# Patient Record
Sex: Female | Born: 1985 | Race: Asian | Hispanic: No | Marital: Married | Smoking: Never smoker
Health system: Southern US, Community
[De-identification: ages and names within clinical notes are randomized; demographics above are authoritative.]

## PROBLEM LIST (undated history)

## (undated) DIAGNOSIS — R2232 Localized swelling, mass and lump, left upper limb: Secondary | ICD-10-CM

## (undated) DIAGNOSIS — Z789 Other specified health status: Secondary | ICD-10-CM

## (undated) HISTORY — PX: MASS EXCISION: SHX2000

## (undated) HISTORY — PX: NO PAST SURGERIES: SHX2092

---

## 2009-12-26 ENCOUNTER — Emergency Department (HOSPITAL_COMMUNITY): Admission: EM | Admit: 2009-12-26 | Discharge: 2009-12-26 | Payer: Self-pay | Admitting: Emergency Medicine

## 2010-06-16 ENCOUNTER — Ambulatory Visit (HOSPITAL_COMMUNITY): Admission: RE | Admit: 2010-06-16 | Payer: Medicaid Other | Source: Ambulatory Visit

## 2010-06-16 ENCOUNTER — Ambulatory Visit (HOSPITAL_COMMUNITY)
Admission: RE | Admit: 2010-06-16 | Discharge: 2010-06-16 | Disposition: A | Discharge status: Home / Self Care | Payer: Medicaid Other | Source: Ambulatory Visit | Attending: Obstetrics and Gynecology | Admitting: Obstetrics and Gynecology

## 2010-06-16 ENCOUNTER — Other Ambulatory Visit (HOSPITAL_COMMUNITY): Payer: Self-pay | Admitting: Obstetrics and Gynecology

## 2010-06-16 ENCOUNTER — Ambulatory Visit (HOSPITAL_COMMUNITY)
Admission: RE | Admit: 2010-06-16 | Discharge: 2010-06-16 | Disposition: A | Discharge status: Home / Self Care | Payer: Medicaid Other | Source: Ambulatory Visit

## 2010-06-16 DIAGNOSIS — Z3689 Encounter for other specified antenatal screening: Secondary | ICD-10-CM | POA: Insufficient documentation

## 2010-06-16 DIAGNOSIS — O365990 Maternal care for other known or suspected poor fetal growth, unspecified trimester, not applicable or unspecified: Secondary | ICD-10-CM | POA: Insufficient documentation

## 2010-06-16 DIAGNOSIS — O3500X0 Maternal care for (suspected) central nervous system malformation or damage in fetus, unspecified, not applicable or unspecified: Secondary | ICD-10-CM | POA: Insufficient documentation

## 2010-06-16 DIAGNOSIS — G9389 Other specified disorders of brain: Secondary | ICD-10-CM

## 2010-06-16 DIAGNOSIS — O350XX0 Maternal care for (suspected) central nervous system malformation in fetus, not applicable or unspecified: Secondary | ICD-10-CM | POA: Insufficient documentation

## 2010-06-16 DIAGNOSIS — Q899 Congenital malformation, unspecified: Secondary | ICD-10-CM

## 2010-06-25 LAB — POCT I-STAT, CHEM 8
Creatinine, Ser: 0.5 mg/dL (ref 0.4–1.2)
Glucose, Bld: 81 mg/dL (ref 70–99)
HCT: 48 % — ABNORMAL HIGH (ref 36.0–46.0)
Hemoglobin: 16.3 g/dL — ABNORMAL HIGH (ref 12.0–15.0)
Potassium: 3.8 mEq/L (ref 3.5–5.1)
Sodium: 138 mEq/L (ref 135–145)
TCO2: 23 mmol/L (ref 0–100)

## 2010-06-25 LAB — URINE MICROSCOPIC-ADD ON

## 2010-06-25 LAB — URINALYSIS, ROUTINE W REFLEX MICROSCOPIC
Glucose, UA: NEGATIVE mg/dL
Hgb urine dipstick: NEGATIVE
Ketones, ur: NEGATIVE mg/dL
Protein, ur: NEGATIVE mg/dL
pH: 7.5 (ref 5.0–8.0)

## 2010-06-25 LAB — POCT PREGNANCY, URINE: Preg Test, Ur: POSITIVE

## 2010-07-16 ENCOUNTER — Other Ambulatory Visit (HOSPITAL_COMMUNITY): Payer: Self-pay | Admitting: Obstetrics and Gynecology

## 2010-07-16 ENCOUNTER — Ambulatory Visit (HOSPITAL_COMMUNITY)
Admission: RE | Admit: 2010-07-16 | Discharge: 2010-07-16 | Disposition: A | Discharge status: Home / Self Care | Payer: Medicaid Other | Source: Ambulatory Visit | Attending: Obstetrics and Gynecology | Admitting: Obstetrics and Gynecology

## 2010-07-16 ENCOUNTER — Encounter (HOSPITAL_COMMUNITY): Payer: Self-pay

## 2010-07-16 DIAGNOSIS — Q899 Congenital malformation, unspecified: Secondary | ICD-10-CM

## 2010-07-16 DIAGNOSIS — Z3689 Encounter for other specified antenatal screening: Secondary | ICD-10-CM | POA: Insufficient documentation

## 2010-07-16 DIAGNOSIS — O350XX0 Maternal care for (suspected) central nervous system malformation in fetus, not applicable or unspecified: Secondary | ICD-10-CM | POA: Insufficient documentation

## 2010-07-16 DIAGNOSIS — O3500X0 Maternal care for (suspected) central nervous system malformation or damage in fetus, unspecified, not applicable or unspecified: Secondary | ICD-10-CM | POA: Insufficient documentation

## 2010-08-07 ENCOUNTER — Ambulatory Visit (HOSPITAL_COMMUNITY)
Admission: RE | Admit: 2010-08-07 | Discharge: 2010-08-07 | Disposition: A | Discharge status: Home / Self Care | Payer: BC Managed Care – PPO | Source: Ambulatory Visit | Attending: Obstetrics and Gynecology | Admitting: Obstetrics and Gynecology

## 2010-08-07 DIAGNOSIS — Z3689 Encounter for other specified antenatal screening: Secondary | ICD-10-CM | POA: Insufficient documentation

## 2010-08-07 DIAGNOSIS — O3500X0 Maternal care for (suspected) central nervous system malformation or damage in fetus, unspecified, not applicable or unspecified: Secondary | ICD-10-CM | POA: Insufficient documentation

## 2010-08-07 DIAGNOSIS — Q899 Congenital malformation, unspecified: Secondary | ICD-10-CM

## 2010-08-07 DIAGNOSIS — O350XX0 Maternal care for (suspected) central nervous system malformation in fetus, not applicable or unspecified: Secondary | ICD-10-CM | POA: Insufficient documentation

## 2010-08-19 ENCOUNTER — Inpatient Hospital Stay (HOSPITAL_COMMUNITY)
Admission: AD | Admit: 2010-08-19 | Discharge: 2010-08-21 | DRG: 373 | Disposition: A | Discharge status: Home / Self Care | Payer: BC Managed Care – PPO | Source: Ambulatory Visit | Attending: Obstetrics and Gynecology | Admitting: Obstetrics and Gynecology

## 2010-08-19 LAB — CBC
MCH: 28 pg (ref 26.0–34.0)
MCHC: 33.6 g/dL (ref 30.0–36.0)
Platelets: 271 10*3/uL (ref 150–400)

## 2010-08-19 LAB — RPR: RPR Ser Ql: NONREACTIVE

## 2010-08-20 LAB — CBC
MCHC: 32.3 g/dL (ref 30.0–36.0)
Platelets: 254 10*3/uL (ref 150–400)
RDW: 14.2 % (ref 11.5–15.5)
WBC: 19.4 10*3/uL — ABNORMAL HIGH (ref 4.0–10.5)

## 2010-08-27 NOTE — Discharge Summary (Signed)
Tanya Nielsen, Tanya Nielsen                    ACCOUNT NO.:  [id number]  MEDICAL RECORD NO.:  [id number]           PATIENT TYPE:  I  LOCATION:  9119                          FACILITY:  [facility]  PHYSICIAN:  Sherron Monday, MD        DATE OF BIRTH:  [birth year]  DATE OF ADMISSION:  08/19/2010 DATE OF DISCHARGE:  08/21/2010                              DISCHARGE SUMMARY   ADMITTING DIAGNOSIS:  Intrauterine pregnancy at term in labor.  DISCHARGE DIAGNOSIS:  Intrauterine pregnancy at term in labor, delivered via spontaneous vaginal delivery.  HISTORY OF PRESENT ILLNESS:  A 25 year old G1 P0 at 38+ in early labor. Cervical change documented at MAU 3 cm to 4 cm.  She states she has had good fetal movement, no loss of fluid, no vaginal bleeding, contractions are increasing in times and frequency.  Pregnancy was complicated by bilateral ventriculomegaly, followed up by ultrasound and has resolved.  PAST MEDICAL HISTORY:  Not significant.  PAST SURGICAL HISTORY:  Not significant.  PAST OBSTETRICS AND GYNECOLOGIC HISTORY:  G1 is the present pregnancy. No history of any sexually transmitted diseases or abnormal Pap smears.  MEDICATIONS:  Prenatal vitamins.  ALLERGIES:  No known drug allergies.  No latex allergies.  SOCIAL HISTORY:  Denies alcohol, tobacco, or drug use.  She is married.  FAMILY HISTORY:  Denied.  PRENATAL LABORATORY DATA:  B+, antibody screen negative.  Hemoglobin 12.8, rubella immune, RPR nonreactive, hepatitis B surface antigen negative, HIV negative.  Platelets 381,000.  Hemoglobin electrophoresis within normal limits.  Gonorrhea negative.  Chlamydia negative.  Quad screen within normal limits.  Glucola 114.  Group B strep was negative.  Ultrasound consistent with LMP with an [facility] of May 17th.  Ultrasound revealed normal anatomy of female infant, ventriculomegaly, and a posterior placenta.  Multiple ultrasounds to follow up ventriculomegaly.  On admission, afebrile.  Vital signs  stable with a benign exam. Cervical exam was 6 cm dilated, 90% effaced, and zero station after AROM was performed.  Pitocin was planned for augmentation if needed.  She progressed to anterior lip complete __________ , placenta +2.  She pushed for approximately 10 minutes __________ anterior lip and pushed for 40 minutes to deliver a viable female infant at 1651 p.m. with Apgars of 8 at 1 minute and 9 at 5 minutes and weight of 5 pounds 15 ounces.  Placenta was expressed intact.  OB laceration:  Second-degree perineal laceration and left labial repaired with 3-0 Vicryl Rapide in a typical fashion.  EBL was less than 500 mL.  Her postpartum course was relatively uncomplicated.  She remained afebrile with vital signs stable throughout.  Hemoglobin decreased from 12.9-10.6.  She was discharged home on postpartum day x2.  At this time, she is ambulating, voiding, and having a normal lochia, and her pain was well controlled.  She voiced understanding to the routine discharge instructions.  Numbers to call with any questions or problems.  She will follow up in the office in approximately 6 weeks.  She is B+, rubella immune.  __________ and bottle feed.  Hemoglobin decreased from 12.9 to  10.6.  We will discuss contraception at her 6-week checkup.  ASSISTANT:  .  Dictating on this by dictation     Sherron Monday, MD     JB/MEDQ  D:  08/21/2010  T:  08/21/2010  Job:  [id number]  Electronically Signed by Sherron Monday MD on 08/27/2010 08:42:50 AM

## 2011-12-11 IMAGING — US US OB TRANSVAGINAL MODIFY
1 series · 13 of 28 positions shown · non-contrast
Comparison: None.

CLINICAL DATA: Back pain.  Pregnant with quantitative beta HCG 2952

OBSTETRIC <14 WK US AND TRANSVAGINAL OB US
TECHNIQUE: Both transabdominal and transvaginal ultrasound
examinations were performed for complete evaluation of the
gestation as well as the maternal uterus, adnexal regions, and
pelvic cul-de-sac.  Transvaginal technique was performed to assess
early pregnancy.

[Series 1: us ob transvaginal modify · 0.26mm/px · 13 of 54 slices shown]
[im 2/54]
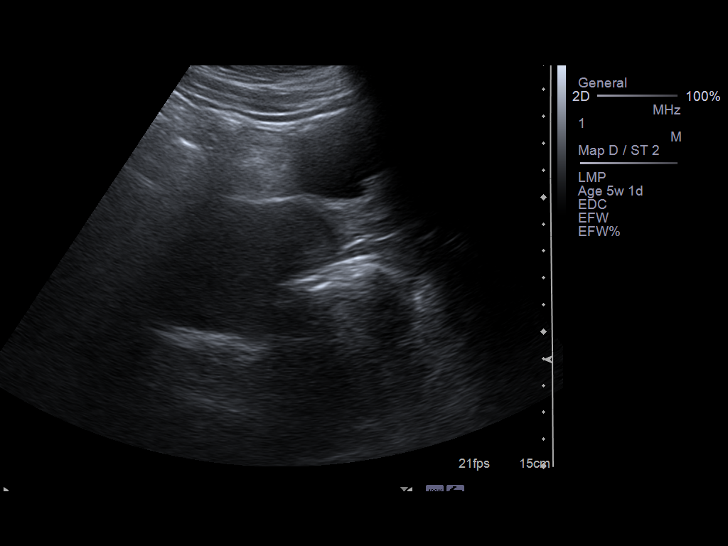
[im 6/54]
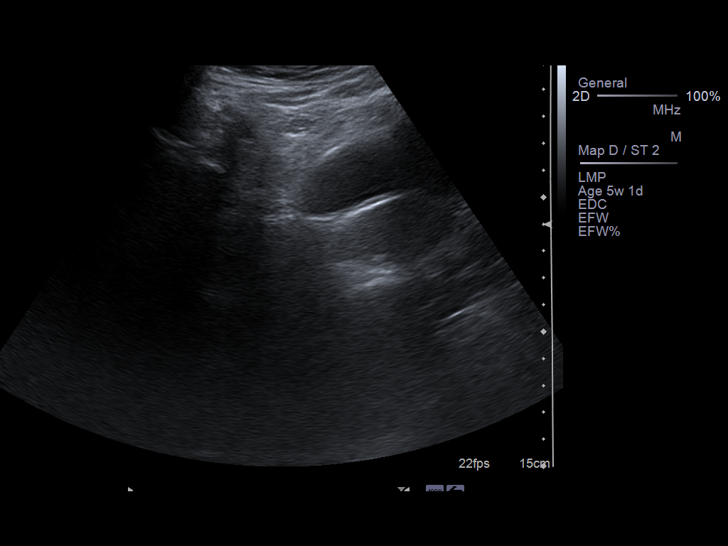
[im 10/54]
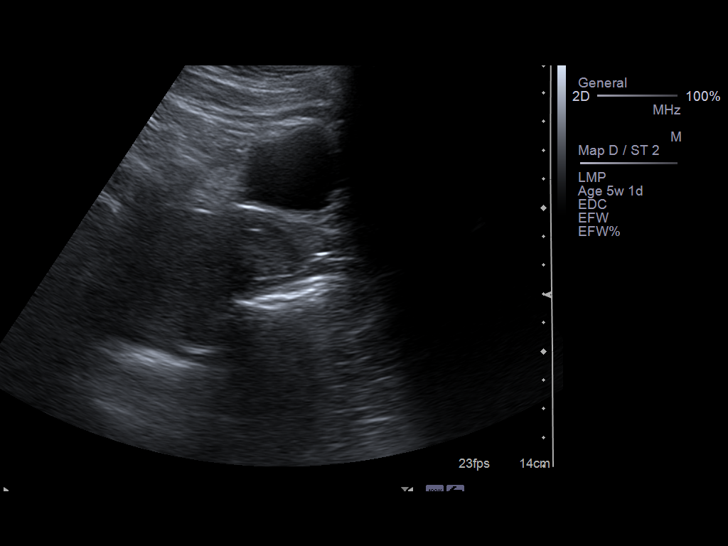
[im 14/54]
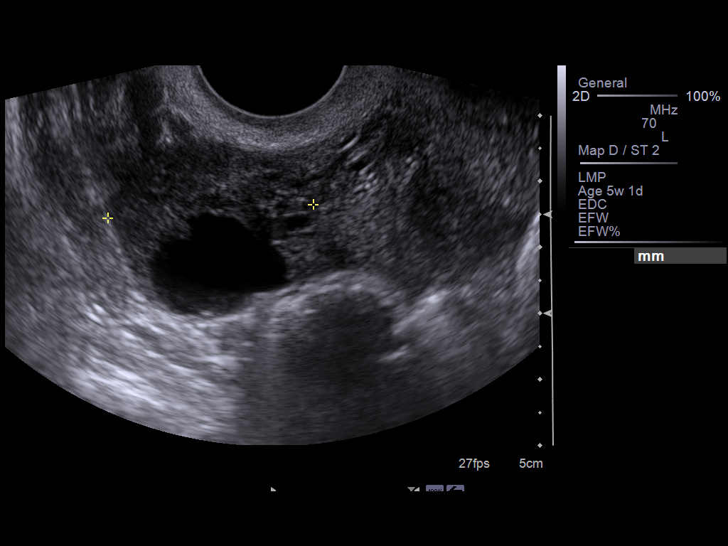
[im 18/54]
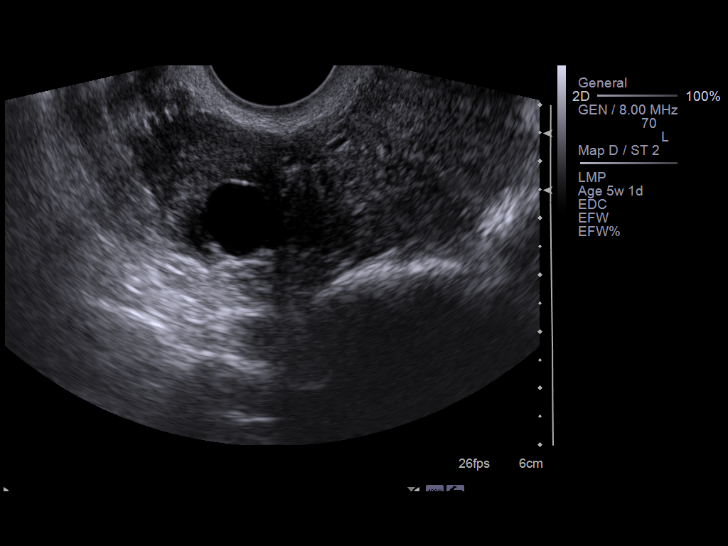
[im 22/54]
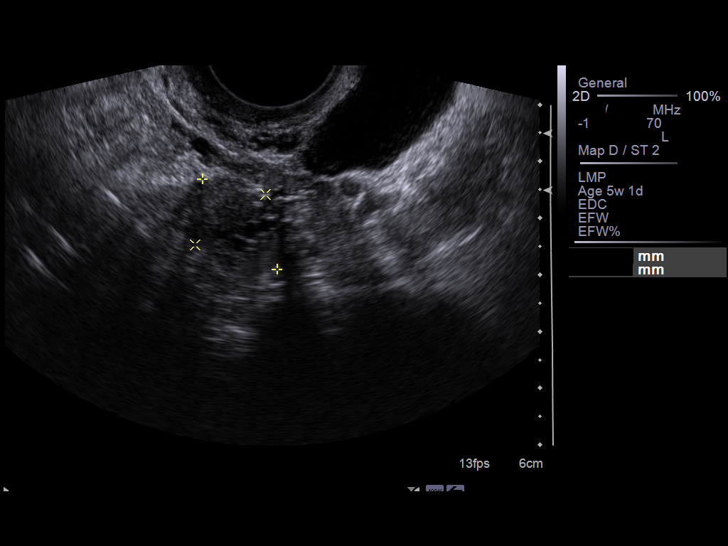
[im 28/54]
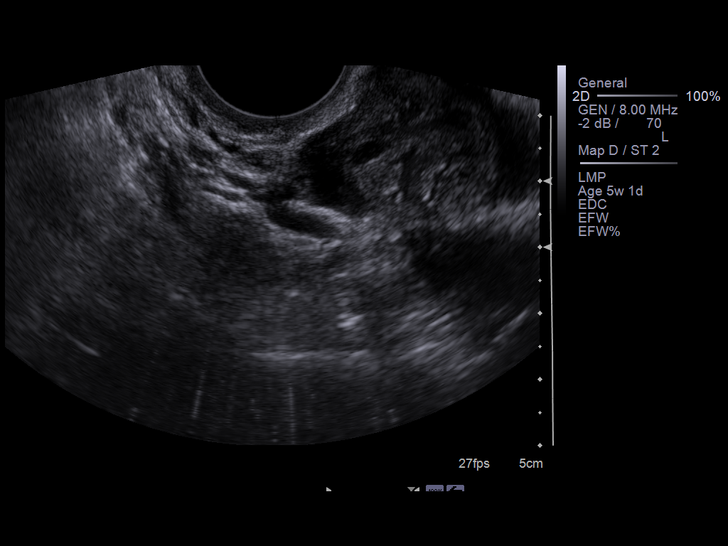
[im 32/54]
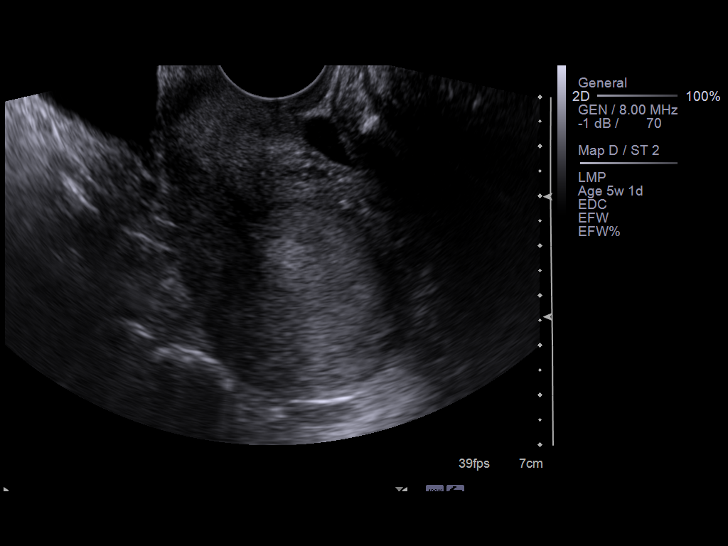
[im 36/54]
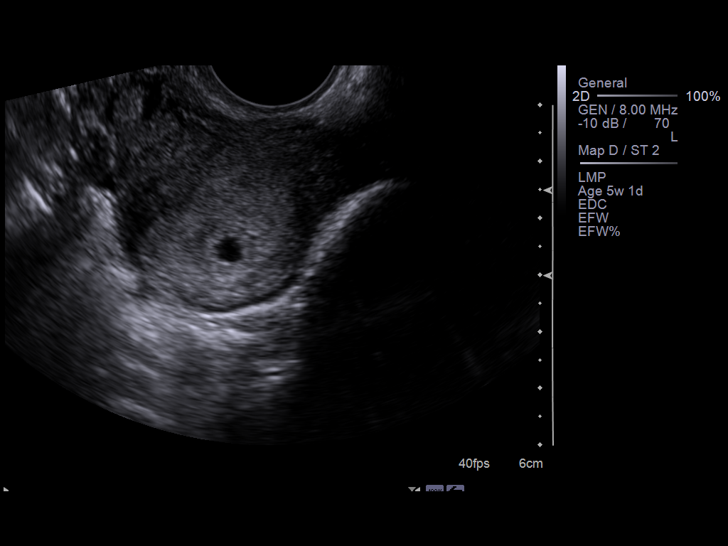
[im 40/54]
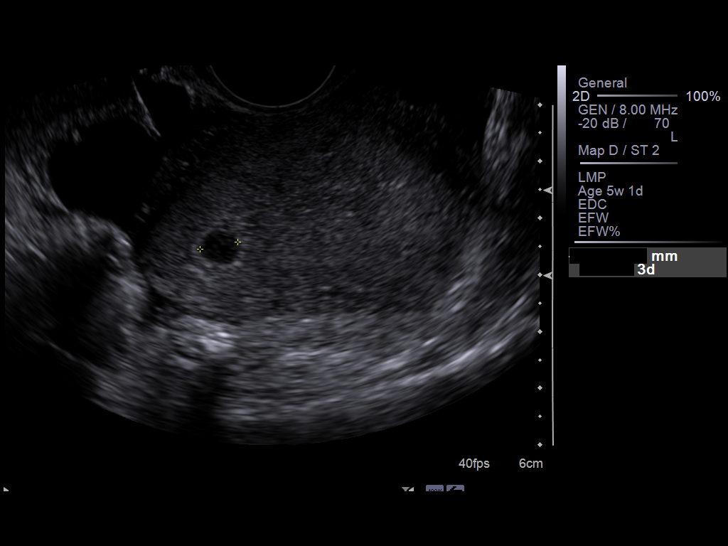
[im 44/54]
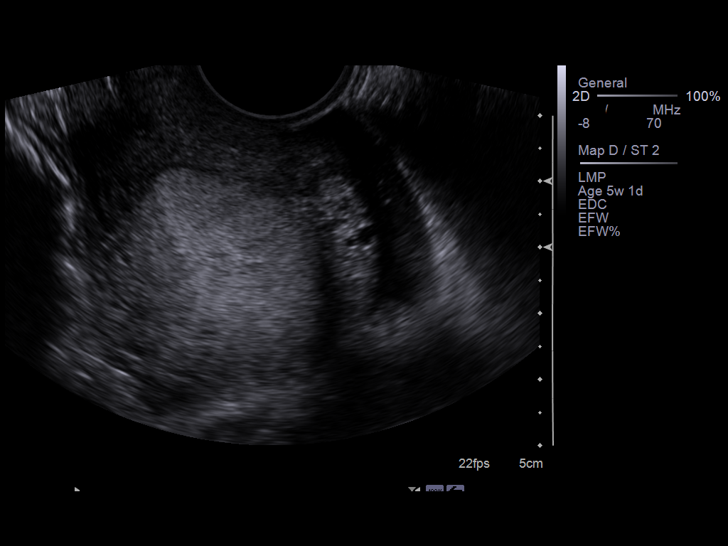
[im 48/54]
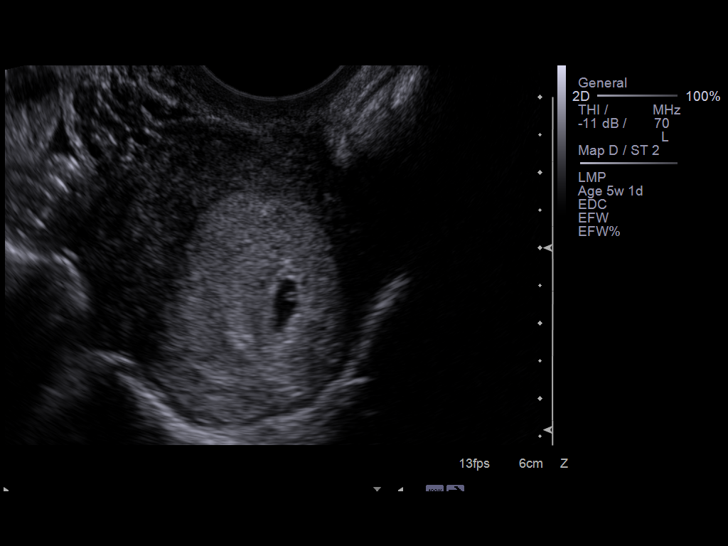
[im 52/54]
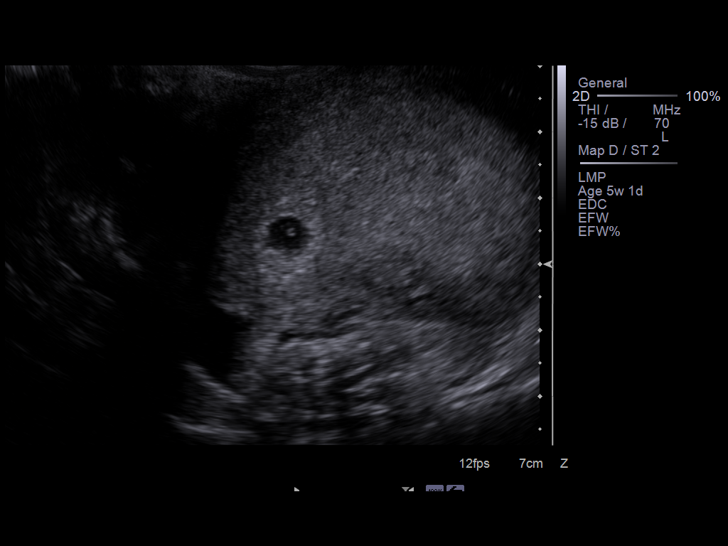

[13 of 28 positions shown; findings below may reference images not displayed]

Intrauterine gestational sac:  Visualized/normal in shape.
Yolk sac: Seen
Embryo: Not seen
Cardiac Activity: Not seen
Heart Rate: Not applicable bpm

MSD: 5.3 mm  mm  5    w 2    d
CRL:    mm     w     d        US EDC:

Maternal uterus/adnexae:
The fundal contour of the uterus appears concave suggesting the
presence of a bicornuate uterus. The gestational sac is located to
the right of midline.

The right ovary measures 3.9 x 3.3 x 3.1 cm and contains a corpus
luteum.  The left ovary has a normal appearance measuring 2.1 x
x 1.5 cm.
IMPRESSION: Intrauterine gestational sac with yolk sac.  No evidence for a
fetal pole is seen but would not be expected at today's mean sac
diameter of 5.3 mm.  With normal growth rates we would expect to
see evidence for a fetal pole in 2 weeks.

Images of the fundal contour of the uterus suggest the possibility
of a bicornuate uterus.

Normal ovaries

## 2012-06-26 ENCOUNTER — Encounter (HOSPITAL_COMMUNITY): Payer: Self-pay | Admitting: *Deleted

## 2012-06-26 ENCOUNTER — Inpatient Hospital Stay (HOSPITAL_COMMUNITY)
Admission: AD | Admit: 2012-06-26 | Discharge: 2012-06-29 | DRG: 765 | Disposition: A | Discharge status: Home / Self Care | Payer: No Typology Code available for payment source | Source: Ambulatory Visit | Attending: Obstetrics and Gynecology | Admitting: Obstetrics and Gynecology

## 2012-06-26 ENCOUNTER — Encounter (HOSPITAL_COMMUNITY): Payer: Self-pay | Admitting: Anesthesiology

## 2012-06-26 ENCOUNTER — Encounter (HOSPITAL_COMMUNITY)
Admission: AD | Disposition: A | Discharge status: Home / Self Care | Payer: Self-pay | Source: Ambulatory Visit | Attending: Obstetrics and Gynecology

## 2012-06-26 ENCOUNTER — Inpatient Hospital Stay (HOSPITAL_COMMUNITY): Payer: No Typology Code available for payment source | Admitting: Anesthesiology

## 2012-06-26 DIAGNOSIS — O321XX0 Maternal care for breech presentation, not applicable or unspecified: Principal | ICD-10-CM | POA: Diagnosis present

## 2012-06-26 HISTORY — DX: Other specified health status: Z78.9

## 2012-06-26 LAB — AMNISURE RUPTURE OF MEMBRANE (ROM) NOT AT ARMC: Amnisure ROM: POSITIVE

## 2012-06-26 LAB — ABO/RH: ABO/RH(D): B POS

## 2012-06-26 LAB — CBC
Platelets: 314 10*3/uL (ref 150–400)
RBC: 4.15 MIL/uL (ref 3.87–5.11)
RDW: 14.9 % (ref 11.5–15.5)
WBC: 13.3 10*3/uL — ABNORMAL HIGH (ref 4.0–10.5)

## 2012-06-26 LAB — TYPE AND SCREEN

## 2012-06-26 SURGERY — Surgical Case
Anesthesia: Spinal | Site: Abdomen | Wound class: Clean Contaminated

## 2012-06-26 MED ORDER — CITRIC ACID-SODIUM CITRATE 334-500 MG/5ML PO SOLN
30.0000 mL | Freq: Once | ORAL | Status: AC
  Administered 2012-06-26: 30 mL via ORAL
  Filled 2012-06-26: qty 15

## 2012-06-26 MED ORDER — NALBUPHINE HCL 10 MG/ML IJ SOLN
5.0000 mg | INTRAMUSCULAR | Status: DC | PRN
  Administered 2012-06-26 – 2012-06-27 (×2): 10 mg via INTRAVENOUS
  Filled 2012-06-26 (×2): qty 1

## 2012-06-26 MED ORDER — IBUPROFEN 600 MG PO TABS: 600.0000 mg | ORAL_TABLET | Freq: Four times a day (QID) | ORAL | Status: DC | PRN

## 2012-06-26 MED ORDER — SIMETHICONE 80 MG PO CHEW
80.0000 mg | CHEWABLE_TABLET | Freq: Three times a day (TID) | ORAL | Status: DC
  Administered 2012-06-28 – 2012-06-29 (×4): 80 mg via ORAL

## 2012-06-26 MED ORDER — METOCLOPRAMIDE HCL 5 MG/ML IJ SOLN: 10.0000 mg | Freq: Three times a day (TID) | INTRAMUSCULAR | Status: DC | PRN

## 2012-06-26 MED ORDER — FAMOTIDINE IN NACL 20-0.9 MG/50ML-% IV SOLN
20.0000 mg | Freq: Once | INTRAVENOUS | Status: AC
  Administered 2012-06-26: 20 mg via INTRAVENOUS
  Filled 2012-06-26: qty 50

## 2012-06-26 MED ORDER — DIPHENHYDRAMINE HCL 50 MG/ML IJ SOLN
INTRAMUSCULAR | Status: AC
  Filled 2012-06-26: qty 1

## 2012-06-26 MED ORDER — ONDANSETRON HCL 4 MG/2ML IJ SOLN: 4.0000 mg | INTRAMUSCULAR | Status: DC | PRN

## 2012-06-26 MED ORDER — NALOXONE HCL 0.4 MG/ML IJ SOLN: 0.4000 mg | INTRAMUSCULAR | Status: DC | PRN

## 2012-06-26 MED ORDER — LACTATED RINGERS IV SOLN
INTRAVENOUS | Status: DC
  Administered 2012-06-26 (×3): via INTRAVENOUS

## 2012-06-26 MED ORDER — ONDANSETRON HCL 4 MG/2ML IJ SOLN: 4.0000 mg | Freq: Three times a day (TID) | INTRAMUSCULAR | Status: DC | PRN

## 2012-06-26 MED ORDER — DIBUCAINE 1 % RE OINT: 1.0000 "application " | TOPICAL_OINTMENT | RECTAL | Status: DC | PRN

## 2012-06-26 MED ORDER — PRENATAL MULTIVITAMIN CH
1.0000 | ORAL_TABLET | Freq: Every day | ORAL | Status: DC
  Administered 2012-06-27 – 2012-06-28 (×2): 1 via ORAL
  Filled 2012-06-26 (×2): qty 1

## 2012-06-26 MED ORDER — DIPHENHYDRAMINE HCL 25 MG PO CAPS
25.0000 mg | ORAL_CAPSULE | ORAL | Status: DC | PRN
  Filled 2012-06-26: qty 1

## 2012-06-26 MED ORDER — DIPHENHYDRAMINE HCL 25 MG PO CAPS: 25.0000 mg | ORAL_CAPSULE | Freq: Four times a day (QID) | ORAL | Status: DC | PRN

## 2012-06-26 MED ORDER — PHENYLEPHRINE HCL 10 MG/ML IJ SOLN
INTRAMUSCULAR | Status: DC | PRN
  Administered 2012-06-26 (×4): 40 ug via INTRAVENOUS

## 2012-06-26 MED ORDER — KETOROLAC TROMETHAMINE 30 MG/ML IJ SOLN
30.0000 mg | Freq: Four times a day (QID) | INTRAMUSCULAR | Status: AC | PRN
  Administered 2012-06-27: 30 mg via INTRAVENOUS
  Filled 2012-06-26: qty 1

## 2012-06-26 MED ORDER — HYDROMORPHONE HCL PF 1 MG/ML IJ SOLN: 0.2500 mg | INTRAMUSCULAR | Status: DC | PRN

## 2012-06-26 MED ORDER — SODIUM CHLORIDE 0.9 % IR SOLN
Status: DC | PRN
  Administered 2012-06-26: 1000 mL

## 2012-06-26 MED ORDER — DIPHENHYDRAMINE HCL 50 MG/ML IJ SOLN
12.5000 mg | INTRAMUSCULAR | Status: DC | PRN
  Administered 2012-06-26: 12.5 mg via INTRAVENOUS

## 2012-06-26 MED ORDER — KETOROLAC TROMETHAMINE 60 MG/2ML IM SOLN
60.0000 mg | Freq: Once | INTRAMUSCULAR | Status: AC | PRN
  Administered 2012-06-26: 60 mg via INTRAMUSCULAR

## 2012-06-26 MED ORDER — ONDANSETRON HCL 4 MG/2ML IJ SOLN
INTRAMUSCULAR | Status: DC | PRN
  Administered 2012-06-26: 4 mg via INTRAVENOUS

## 2012-06-26 MED ORDER — LACTATED RINGERS IV SOLN
INTRAVENOUS | Status: DC
  Administered 2012-06-26 – 2012-06-27 (×2): via INTRAVENOUS

## 2012-06-26 MED ORDER — KETOROLAC TROMETHAMINE 60 MG/2ML IM SOLN
INTRAMUSCULAR | Status: AC
  Filled 2012-06-26: qty 2

## 2012-06-26 MED ORDER — OXYTOCIN 40 UNITS IN LACTATED RINGERS INFUSION - SIMPLE MED: 62.5000 mL/h | INTRAVENOUS | Status: AC

## 2012-06-26 MED ORDER — ONDANSETRON HCL 4 MG PO TABS: 4.0000 mg | ORAL_TABLET | ORAL | Status: DC | PRN

## 2012-06-26 MED ORDER — FENTANYL CITRATE 0.05 MG/ML IJ SOLN
INTRAMUSCULAR | Status: DC | PRN
  Administered 2012-06-26: 25 ug via INTRATHECAL

## 2012-06-26 MED ORDER — NALOXONE HCL 1 MG/ML IJ SOLN: 1.0000 ug/kg/h | INTRAVENOUS | Status: DC | PRN

## 2012-06-26 MED ORDER — CEFAZOLIN SODIUM-DEXTROSE 2-3 GM-% IV SOLR
2.0000 g | Freq: Once | INTRAVENOUS | Status: AC
  Administered 2012-06-26: 2 g via INTRAVENOUS
  Filled 2012-06-26: qty 50

## 2012-06-26 MED ORDER — SIMETHICONE 80 MG PO CHEW
80.0000 mg | CHEWABLE_TABLET | ORAL | Status: DC | PRN
  Administered 2012-06-27 – 2012-06-28 (×2): 80 mg via ORAL

## 2012-06-26 MED ORDER — TETANUS-DIPHTH-ACELL PERTUSSIS 5-2.5-18.5 LF-MCG/0.5 IM SUSP: 0.5000 mL | Freq: Once | INTRAMUSCULAR | Status: DC

## 2012-06-26 MED ORDER — SODIUM CHLORIDE 0.9 % IJ SOLN: 3.0000 mL | INTRAMUSCULAR | Status: DC | PRN

## 2012-06-26 MED ORDER — SENNOSIDES-DOCUSATE SODIUM 8.6-50 MG PO TABS
2.0000 | ORAL_TABLET | Freq: Every day | ORAL | Status: DC
  Administered 2012-06-27 – 2012-06-28 (×2): 2 via ORAL

## 2012-06-26 MED ORDER — ZOLPIDEM TARTRATE 5 MG PO TABS: 5.0000 mg | ORAL_TABLET | Freq: Every evening | ORAL | Status: DC | PRN

## 2012-06-26 MED ORDER — IBUPROFEN 600 MG PO TABS
600.0000 mg | ORAL_TABLET | Freq: Four times a day (QID) | ORAL | Status: DC
  Administered 2012-06-27 – 2012-06-29 (×8): 600 mg via ORAL
  Filled 2012-06-26 (×8): qty 1

## 2012-06-26 MED ORDER — SCOPOLAMINE 1 MG/3DAYS TD PT72
1.0000 | MEDICATED_PATCH | Freq: Once | TRANSDERMAL | Status: DC
  Administered 2012-06-26: 1.5 mg via TRANSDERMAL

## 2012-06-26 MED ORDER — OXYTOCIN 10 UNIT/ML IJ SOLN
40.0000 [IU] | INTRAVENOUS | Status: DC | PRN
  Administered 2012-06-26: 40 [IU] via INTRAVENOUS

## 2012-06-26 MED ORDER — KETOROLAC TROMETHAMINE 30 MG/ML IJ SOLN: 30.0000 mg | Freq: Four times a day (QID) | INTRAMUSCULAR | Status: AC | PRN

## 2012-06-26 MED ORDER — OXYCODONE-ACETAMINOPHEN 5-325 MG PO TABS
1.0000 | ORAL_TABLET | ORAL | Status: DC | PRN
  Administered 2012-06-28: 1 via ORAL
  Filled 2012-06-26: qty 1

## 2012-06-26 MED ORDER — MORPHINE SULFATE (PF) 0.5 MG/ML IJ SOLN
INTRAMUSCULAR | Status: DC | PRN
  Administered 2012-06-26: .15 ug via INTRATHECAL

## 2012-06-26 MED ORDER — DIPHENHYDRAMINE HCL 50 MG/ML IJ SOLN: 25.0000 mg | INTRAMUSCULAR | Status: DC | PRN

## 2012-06-26 MED ORDER — NAPHAZOLINE HCL 0.1 % OP SOLN
1.0000 [drp] | Freq: Four times a day (QID) | OPHTHALMIC | Status: DC | PRN
  Filled 2012-06-26: qty 15

## 2012-06-26 MED ORDER — MENTHOL 3 MG MT LOZG: 1.0000 | LOZENGE | OROMUCOSAL | Status: DC | PRN

## 2012-06-26 MED ORDER — MEPERIDINE HCL 25 MG/ML IJ SOLN: 6.2500 mg | INTRAMUSCULAR | Status: DC | PRN

## 2012-06-26 MED ORDER — SCOPOLAMINE 1 MG/3DAYS TD PT72
MEDICATED_PATCH | TRANSDERMAL | Status: AC
  Filled 2012-06-26: qty 1

## 2012-06-26 MED ORDER — WITCH HAZEL-GLYCERIN EX PADS: 1.0000 "application " | MEDICATED_PAD | CUTANEOUS | Status: DC | PRN

## 2012-06-26 MED ORDER — NALBUPHINE HCL 10 MG/ML IJ SOLN: 5.0000 mg | INTRAMUSCULAR | Status: DC | PRN

## 2012-06-26 MED ORDER — LANOLIN HYDROUS EX OINT
1.0000 | TOPICAL_OINTMENT | CUTANEOUS | Status: DC | PRN
Start: 2012-06-26 — End: 2012-06-29

## 2012-06-26 SURGICAL SUPPLY — 34 items
BENZOIN TINCTURE PRP APPL 2/3 (GAUZE/BANDAGES/DRESSINGS) ×2 IMPLANT
CLOTH BEACON ORANGE TIMEOUT ST (SAFETY) ×2 IMPLANT
CONTAINER PREFILL 10% NBF 15ML (MISCELLANEOUS) IMPLANT
DRAPE LG THREE QUARTER DISP (DRAPES) ×2 IMPLANT
DRSG OPSITE POSTOP 4X10 (GAUZE/BANDAGES/DRESSINGS) ×2 IMPLANT
DURAPREP 26ML APPLICATOR (WOUND CARE) ×2 IMPLANT
ELECT REM PT RETURN 9FT ADLT (ELECTROSURGICAL) ×2
ELECTRODE REM PT RTRN 9FT ADLT (ELECTROSURGICAL) ×1 IMPLANT
EXTRACTOR VACUUM KIWI (MISCELLANEOUS) IMPLANT
EXTRACTOR VACUUM M CUP 4 TUBE (SUCTIONS) IMPLANT
GLOVE BIO SURGEON STRL SZ 6.5 (GLOVE) ×2 IMPLANT
GOWN STRL REIN XL XLG (GOWN DISPOSABLE) ×4 IMPLANT
KIT ABG SYR 3ML LUER SLIP (SYRINGE) IMPLANT
NEEDLE HYPO 25X5/8 SAFETYGLIDE (NEEDLE) IMPLANT
NS IRRIG 1000ML POUR BTL (IV SOLUTION) ×2 IMPLANT
PACK C SECTION WH (CUSTOM PROCEDURE TRAY) ×2 IMPLANT
PAD OB MATERNITY 4.3X12.25 (PERSONAL CARE ITEMS) ×2 IMPLANT
RTRCTR C-SECT PINK 25CM LRG (MISCELLANEOUS) ×2 IMPLANT
SLEEVE SCD COMPRESS KNEE MED (MISCELLANEOUS) ×2 IMPLANT
STAPLER VISISTAT 35W (STAPLE) IMPLANT
STRIP CLOSURE SKIN 1/2X4 (GAUZE/BANDAGES/DRESSINGS) ×2 IMPLANT
SUT CHROMIC 1 CTX 36 (SUTURE) ×6 IMPLANT
SUT PLAIN 0 NONE (SUTURE) IMPLANT
SUT PLAIN 2 0 (SUTURE) ×1
SUT PLAIN 2 0 XLH (SUTURE) IMPLANT
SUT PLAIN ABS 2-0 CT1 27XMFL (SUTURE) ×1 IMPLANT
SUT VIC AB 0 CT1 27 (SUTURE) ×2
SUT VIC AB 0 CT1 27XBRD ANBCTR (SUTURE) ×2 IMPLANT
SUT VIC AB 2-0 CT1 27 (SUTURE) ×1
SUT VIC AB 2-0 CT1 TAPERPNT 27 (SUTURE) ×1 IMPLANT
SUT VIC AB 4-0 KS 27 (SUTURE) ×2 IMPLANT
TOWEL OR 17X24 6PK STRL BLUE (TOWEL DISPOSABLE) ×6 IMPLANT
TRAY FOLEY CATH 14FR (SET/KITS/TRAYS/PACK) ×2 IMPLANT
WATER STERILE IRR 1000ML POUR (IV SOLUTION) ×2 IMPLANT

## 2012-06-26 NOTE — Transfer of Care (Signed)
Immediate Anesthesia Transfer of Care Note  Patient: Tanya Nielsen  Procedure(s) Performed: Procedure(s):  Primary cesarean section with delivery of baby boy at 55. Apgars 9/9. (N/A)  Patient Location: PACU  Anesthesia Type:Spinal  Level of Consciousness: awake, alert  and oriented  Airway & Oxygen Therapy: Patient Spontanous Breathing  Post-op Assessment: Report given to PACU RN and Post -op Vital signs reviewed and stable  Post vital signs: Reviewed and stable  Complications: No apparent anesthesia complications

## 2012-06-26 NOTE — Anesthesia Postprocedure Evaluation (Signed)
Anesthesia Post Note  Patient: Tanya Nielsen  Procedure(s) Performed: Procedure(s) (LRB):  Primary cesarean section with delivery of baby boy at 51. Apgars 9/9. (N/A)  Anesthesia type: Spinal  Patient location: PACU  Post pain: Pain level controlled  Post assessment: Post-op Vital signs reviewed  Last Vitals:  Filed Vitals:   06/26/12 1930  BP: 111/74  Pulse: 97  Temp:   Resp: 24    Post vital signs: Reviewed  Level of consciousness: awake  Complications: No apparent anesthesia complications

## 2012-06-26 NOTE — Brief Op Note (Signed)
06/26/2012  7:18 PM  PATIENT:  Tanya Nielsen  27 y.o. female  PRE-OPERATIVE DIAGNOSIS:   Breech presentation/ruptured membranes Preterm pregnancy at 23 3/7 weeks  POST-OPERATIVE DIAGNOSIS:  breech presentation/ruptured membranes  PROCEDURE:  Procedure(s):  Primary cesarean section with delivery of baby boy at 86. Apgars 9/9. (N/A)  SURGEON:  Surgeon(s) and Role:    * Oliver Pila, MD - Primary  ANESTHESIA:   spinal  EBL:   800cc  BLOOD ADMINISTERED:none  DRAINS: Urinary Catheter (Foley)   LOCAL MEDICATIONS USED:  NONE  SPECIMEN:  Placenta  DISPOSITION OF SPECIMEN:  L&D  COUNTS:  YES  TOURNIQUET:  * No tourniquets in log *  DICTATION: .Dragon Dictation  PLAN OF CARE: Admit to inpatient   PATIENT DISPOSITION:  PACU - hemodynamically stable.

## 2012-06-26 NOTE — MAU Note (Signed)
Sent over from office for [facility]; pt at regular appt today, ? Leaking since Sat night, AFI 8.

## 2012-06-26 NOTE — Op Note (Signed)
Operative report  Preoperative diagnosis Preterm pregnancy at 36-3/7 weeks Spontaneous rupture of membranes Breech presentation  Postoperative diagnosis Same  Procedure Primary low transverse C-section with 2 layer closure of uterus  Surgeon Dr. Huel Cote  Anesthesia Spinal  Fluids Estimated blood loss 800 cc Urine output 200 cc clear urine IV fluids 2200 cc LR  Findings There is a viable female infant in the frank breech presentation. Apgars were 8 and 9. Weight pending at time of dictation. Uterus tubes and ovaries were all within normal limits   Specimen Placenta sent to L&D  Procedure note After informed consent was obtained from the patient she was taken to the operating room where spinal anesthesia was obtained without difficulty. She was then prepped and draped in the normal sterile fashion in the dorsal supine position with a leftward tilt. An appropriate time out was performed. A Pfannenstiel skin incision was then made with the scalpel and carried through to the underlying layer fascia by sharp dissection and Bovie cautery. The fascia was nicked in the midline and the incision was extended laterally. The inferior aspect of the incision was grasped with Coker clamps elevated and dissected off the underlying rectus muscles. In a similar fashion the superior aspect was dissected off the rectus muscles. Rectus muscles were separated in the midline and the peritoneum entered bluntly. Peritoneal incision was then extended both superiorly and inferiorly with careful attention to avoid both bowel and bladder. The Alexis self-retaining wound retractor was then placed within the incision and the lower uterine segment exposed. The lower uterine segment was then incised in a transverse fashion and the cavity itself entered bluntly. The infant was then delivered from the frank breech presentation without difficulty the legs were easily reduced as well as the arms swept across the  chest and head delivered in a flexed position. Cord was clamped and cut and infant  handed to the waiting pediatricians. The placenta was then spontaneously expressed and the uterus cleared of all clots and debris with a moist lap sponge. The uterus was then closed in 2 layers the first a running locked layer of 1-0 chromic and the second an imbricating layer of the same suture. 2 additional figure-of-eight sutures were placed at the incision line for hemostasis. Tubes and ovaries were inspected and the gutters cleared of all clots and debris. The incision appeared hemostatic therefore all instruments and sponges were removed from the abdomen. The rectus muscles and peritoneum were reapproximated with several interrupted mattress sutures of 2-0 Vicryl. The fascia was then closed with 0 Vicryl in a running fashion. The subcutaneous tissue was reapproximated with 2-0 plain in running fashion. The skin was closed with 4-0 Vicryl in a subcuticular stitch on a Keith needle. The incision was then reinforced with benzoin and Steri-Strips and a dressing applied. Again all instrument sponge counts were correct and the patient was taken to the recovery room in good condition with her baby accompanying her.

## 2012-06-26 NOTE — Anesthesia Preprocedure Evaluation (Addendum)
Anesthesia Evaluation  Patient identified by MRN, date of birth, ID band Patient awake    Reviewed: Allergy & Precautions, H&P , Patient's Chart, lab work & pertinent test results  Airway Mallampati: IV TM Distance: >3 FB Neck ROM: full    Dental no notable dental hx.    Pulmonary  breath sounds clear to auscultation  Pulmonary exam normal       Cardiovascular Exercise Tolerance: Good Rhythm:regular Rate:Normal     Neuro/Psych    GI/Hepatic   Endo/Other    Renal/GU      Musculoskeletal   Abdominal   Peds  Hematology   Anesthesia Other Findings   Reproductive/Obstetrics                          Anesthesia Physical Anesthesia Plan  ASA: II  Anesthesia Plan: Spinal   Post-op Pain Management:    Induction:   Airway Management Planned:   Additional Equipment:   Intra-op Plan:   Post-operative Plan:   Informed Consent: I have reviewed the patients History and Physical, chart, labs and discussed the procedure including the risks, benefits and alternatives for the proposed anesthesia with the patient or authorized representative who has indicated his/her understanding and acceptance.   Dental Advisory Given  Plan Discussed with: CRNA  Anesthesia Plan Comments: (Lab work confirmed with CRNA in room. Platelets okay. Discussed spinal anesthetic, and patient consents to the procedure:  included risk of possible headache,backache, failed block, allergic reaction, and nerve injury. This patient was asked if she had any questions or concerns before the procedure started. )        Anesthesia Quick Evaluation

## 2012-06-26 NOTE — OR Nursing (Addendum)
Uterus massaged by S. Tallyn Holroyd after cesarean section. Two tubes of cord blood sent to lab.  30cc of blood evacuated from uterus during uterine massage.

## 2012-06-26 NOTE — Anesthesia Procedure Notes (Signed)

## 2012-06-26 NOTE — H&P (Signed)
Tanya Nielsen is a 27 y.o. female G2P1001 at 36+ weeks (EDD 07/21/12 by LMP c/w 10 week [place]) presenting for possible ROM for 3 days.  Pt came to office for possible ROM and was found to be nitrazine positive, but fern negative.  AFI was borderline at 8.  Baby persistent breech lie.  Prenatal care was otherwise uncomplicated.     Maternal Medical History:  Reason for admission: Rupture of membranes.   Contractions: Onset was more than 2 days ago.   Frequency: rare.   Perceived severity is mild.    Fetal activity: Perceived fetal activity is normal.    Prenatal Complications - Diabetes: none.    OB History   Grav Para Term Preterm Abortions TAB SAB Ect Mult Living   2 1 1       1     NSVD 08/2010 5lbs 15 oz  Past Medical History  Diagnosis Date  . Medical history non-contributory    Past Surgical History  Procedure Laterality Date  . No past surgeries     Family History: family history is not on file. Social History:  reports that she has never smoked. She does not have any smokeless tobacco history on file. She reports that she does not drink alcohol or use illicit drugs.   Prenatal Transfer Tool  Maternal Diabetes: No Genetic Screening: Normal Maternal Ultrasounds/Referrals: Normal Fetal Ultrasounds or other Referrals:  None Maternal Substance Abuse:  No Significant Maternal Medications:  None Significant Maternal Lab Results:  None Other Comments:  None  ROS    Blood pressure 105/68, pulse 109, temperature 98.3 F (36.8 C), temperature source Oral, resp. rate 18, height 4\' 11"  (1.499 m), weight 54.885 kg (121 lb). Maternal Exam:  Uterine Assessment: Contraction strength is mild.  Contraction frequency is irregular.   Abdomen: Patient reports no abdominal tenderness. Estimated fetal weight is 5 1/2-6 lbs.   Fetal presentation: breech  Introitus: Normal vulva. Normal vagina.  Ferning test: negative.  Nitrazine test: positive. Amniotic fluid character:  clear.     Physical Exam  Constitutional: She is oriented to person, place, and time. She appears well-developed and well-nourished.  Cardiovascular: Normal rate and regular rhythm.   Respiratory: Effort normal and breath sounds normal.  GI: Soft. Bowel sounds are normal.  Genitourinary: Vagina normal.  Uterus gravid  Neurological: She is alert and oriented to person, place, and time.  Psychiatric: She has a normal mood and affect. Her behavior is normal.    Prenatal labs: ABO, Rh:  B positive Antibody:  negative  Rubella:  Immune RPR:   neg  HBsAg:   neg HIV:   neg GBS:   unknown--result not returned yet One hour GCT 153 Three hour GTT WNL First trimester screen and AFP WNL CF neg Hgb AA  Assessment/Plan: Pt had amniosure and ROM confirmed with positive result.  Pt counseled re: proceeding with C/S.  Risks and benefits reviewed including bleeding, infection and possible damage to bowel and bladder desires to proceed.  Last ate noodles at 12 noon, will proceed at 6pm per anesthesia.    Oliver Pila 06/26/2012, 4:39 PM

## 2012-06-27 ENCOUNTER — Encounter (HOSPITAL_COMMUNITY): Payer: Self-pay | Admitting: Obstetrics and Gynecology

## 2012-06-27 LAB — CBC
MCV: 78.6 fL (ref 78.0–100.0)
Platelets: 253 10*3/uL (ref 150–400)
RBC: 3.45 MIL/uL — ABNORMAL LOW (ref 3.87–5.11)
WBC: 16.1 10*3/uL — ABNORMAL HIGH (ref 4.0–10.5)

## 2012-06-27 NOTE — Progress Notes (Signed)
Subjective: Postpartum Day 1: Cesarean Delivery Patient reports incisional pain and tolerating PO.    Objective: Vital signs in last 24 hours: Temp:  [98 F (36.7 C)-98.9 F (37.2 C)] 98.2 F (36.8 C) (03/18 0630) Pulse Rate:  [90-116] 97 (03/18 0630) Resp:  [16-25] 20 (03/18 0630) BP: (69-114)/(35-83) 93/60 mmHg (03/18 0630) SpO2:  [95 %-100 %] 96 % (03/18 0630) Weight:  [54.885 kg (121 lb)] 54.885 kg (121 lb) (03/17 1728)  Physical Exam:  General: alert and cooperative Lochia: appropriate Uterine Fundus: firm Incision:c/d/i    Recent Labs  06/26/12 1712 06/27/12 0610  HGB 10.5* 8.7*  HCT 32.6* 27.1*    Assessment/Plan: Status post Cesarean section. Doing well postoperatively.  Continue current care.  Tanya Nielsen 06/27/2012, 8:33 AM

## 2012-06-27 NOTE — Anesthesia Postprocedure Evaluation (Signed)
  Anesthesia Post-op Note  Patient: Tanya Nielsen  Procedure(s) Performed: Procedure(s):  Primary cesarean section with delivery of baby boy at 15. Apgars 9/9. (N/A)  Patient Location: Mother/Baby  Anesthesia Type:Spinal  Level of Consciousness: awake  Airway and Oxygen Therapy: Patient Spontanous Breathing  Post-op Pain: mild  Post-op Assessment: Patient's Cardiovascular Status Stable, Respiratory Function Stable, Patent Airway, No signs of Nausea or vomiting, Adequate PO intake, Pain level controlled, No headache, No backache, No residual numbness and No residual motor weakness  Post-op Vital Signs: Reviewed and stable  Complications: No apparent anesthesia complications

## 2012-06-28 NOTE — Progress Notes (Signed)
Subjective: Postpartum Day 2: Cesarean Delivery Patient reports tolerating PO, + flatus and no problems voiding.    Objective: Vital signs in last 24 hours: Temp:  [97.2 F (36.2 C)-99 F (37.2 C)] 97.2 F (36.2 C) (03/19 0528) Pulse Rate:  [96-99] 99 (03/19 0528) Resp:  [18-20] 18 (03/19 0528) BP: (97-102)/(62-66) 97/62 mmHg (03/19 0528) SpO2:  [96 %] 96 % (03/18 2000)  Physical Exam:  General: alert and cooperative Lochia: appropriate Uterine Fundus: firm Incision: C/D/I    Recent Labs  06/26/12 1712 06/27/12 0610  HGB 10.5* 8.7*  HCT 32.6* 27.1*    Assessment/Plan: Status post Cesarean section. Doing well postoperatively.  Continue current care.  Tanya Nielsen 06/28/2012, 8:54 AM

## 2012-06-29 MED ORDER — IBUPROFEN 600 MG PO TABS: 600.0000 mg | ORAL_TABLET | Freq: Four times a day (QID) | ORAL | Status: DC

## 2012-06-29 MED ORDER — OXYCODONE-ACETAMINOPHEN 5-325 MG PO TABS: 1.0000 | ORAL_TABLET | ORAL | Status: DC | PRN

## 2012-06-29 NOTE — Discharge Summary (Signed)
Obstetric Discharge Summary Reason for Admission: rupture of membranes and breech Prenatal Procedures: ultrasound Intrapartum Procedures: cesarean: low cervical, transverse Postpartum Procedures: none Complications-Operative and Postpartum: none Hemoglobin  Date Value Range Status  06/27/2012 8.7* 12.0 - 15.0 g/dL Final     REPEATED TO VERIFY     DELTA CHECK NOTED     HCT  Date Value Range Status  06/27/2012 27.1* 36.0 - 46.0 % Final    Physical Exam:  General: alert and cooperative Lochia: appropriate Uterine Fundus: firm Incision: healing well, no significant erythema  Discharge Diagnoses: Preterm pregnancy delivered at 36+ weeks  Discharge Information: Date: 06/29/2012 Activity: pelvic rest Diet: routine Medications: Ibuprofen and Percocet Condition: improved Instructions: refer to practice specific booklet Discharge to: home Follow-up Information   Follow up with Oliver Pila, MD. Schedule an appointment as soon as possible for a visit in 2 weeks. (incision check)    Contact information:   [address], [address] [place] [address] [phone]       Newborn Data: Live born female  Birth Weight: 5 lb 12.9 oz (2634 g) APGAR: 9, 9  Home with mother.  Oliver Pila 06/29/2012, 8:35 AM

## 2012-06-29 NOTE — Progress Notes (Signed)
Subjective: Postpartum Day 3 Cesarean Delivery Patient reports tolerating PO, + flatus, + BM and no problems voiding.    Objective: Vital signs in last 24 hours: Temp:  [97.4 F (36.3 C)-99.2 F (37.3 C)] 97.4 F (36.3 C) (03/20 0644) Pulse Rate:  [87-98] 87 (03/20 0644) Resp:  [18] 18 (03/20 0644) BP: (96-104)/(63-64) 96/64 mmHg (03/20 1610)  Physical Exam:  General: alert and cooperative Lochia: appropriate Uterine Fundus: firm Incision: healing well, no significant erythema    Recent Labs  06/26/12 1712 06/27/12 0610  HGB 10.5* 8.7*  HCT 32.6* 27.1*    Assessment/Plan: Status post Cesarean section. Doing well postoperatively.  Discharge home with standard precautions and return to office in 2 weeks for incision check. Motrin and percocet  Rosia Syme W 06/29/2012, 8:34 AM

## 2012-06-30 IMAGING — US US OB FOLLOW-UP
1 series · 14 of 28 positions shown · non-contrast
Comparison: none

[Series 1: us ob follow-up · 14 of 35 slices shown]
[im 2/35]
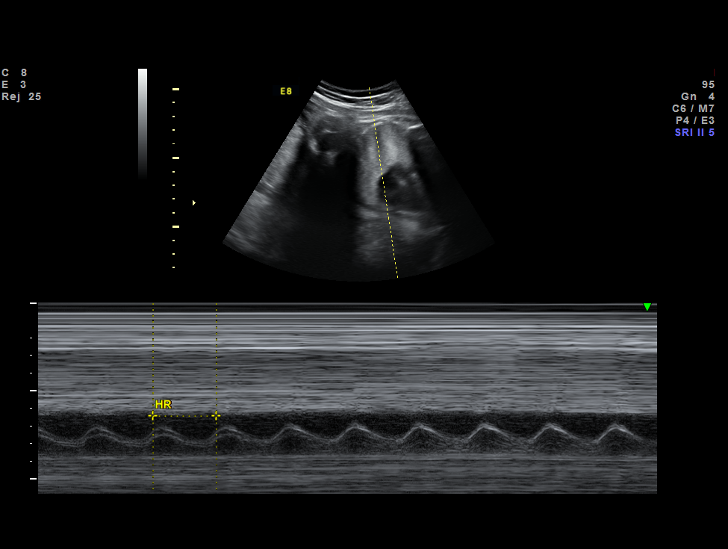
[im 4/35]
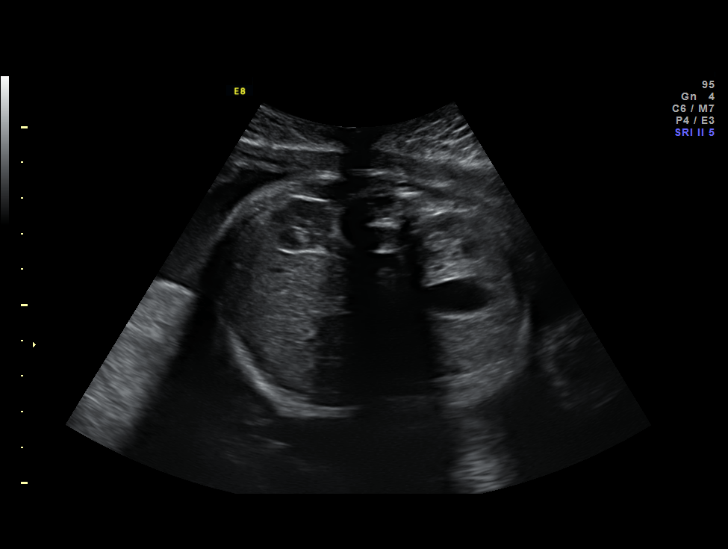
[im 7/35]
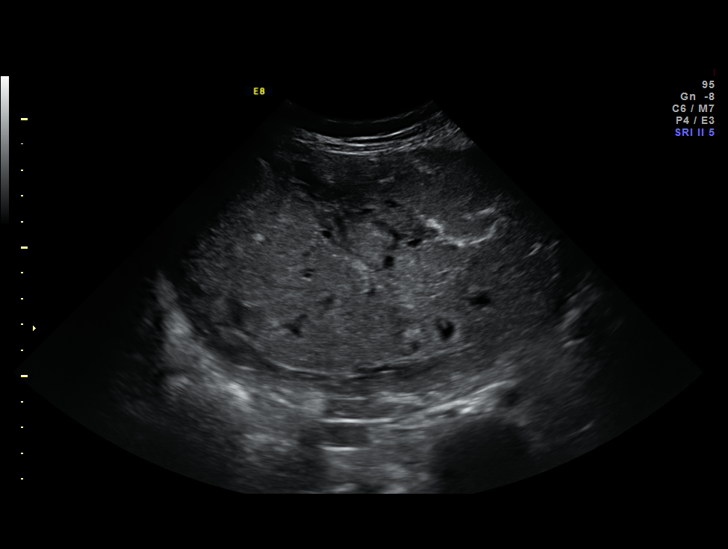
[im 9/35]
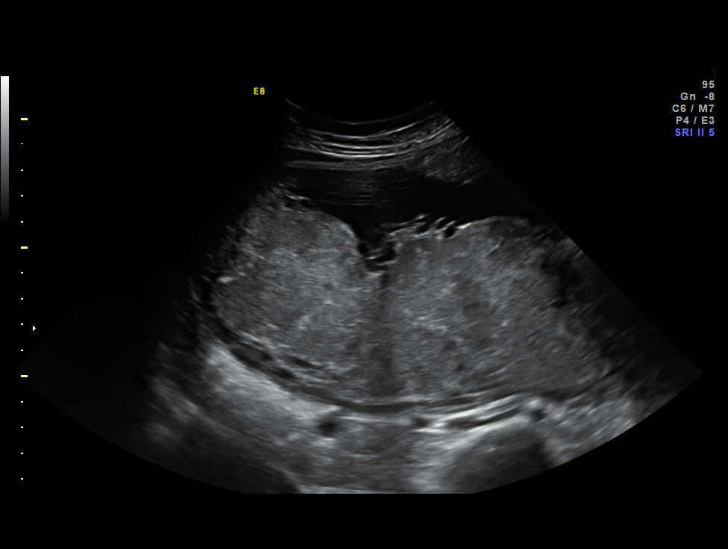
[im 12/35]
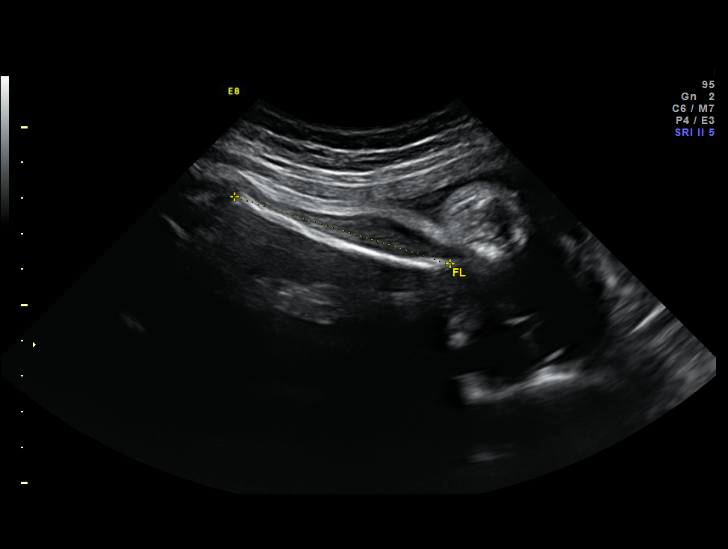
[im 14/35]
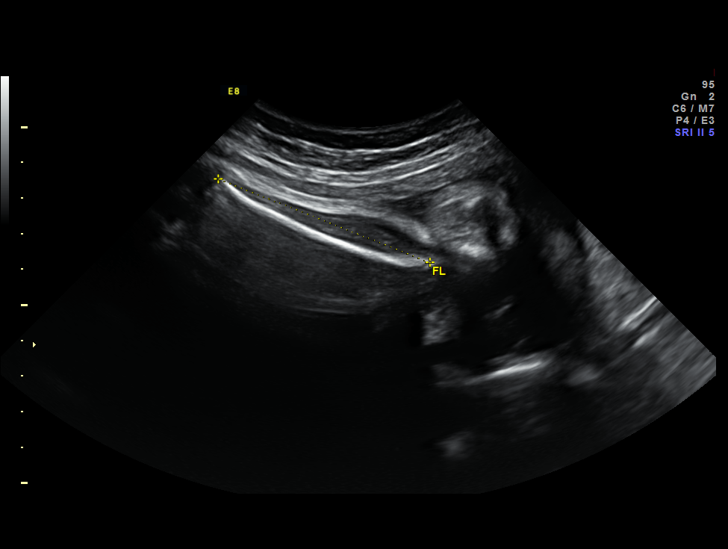
[im 17/35]
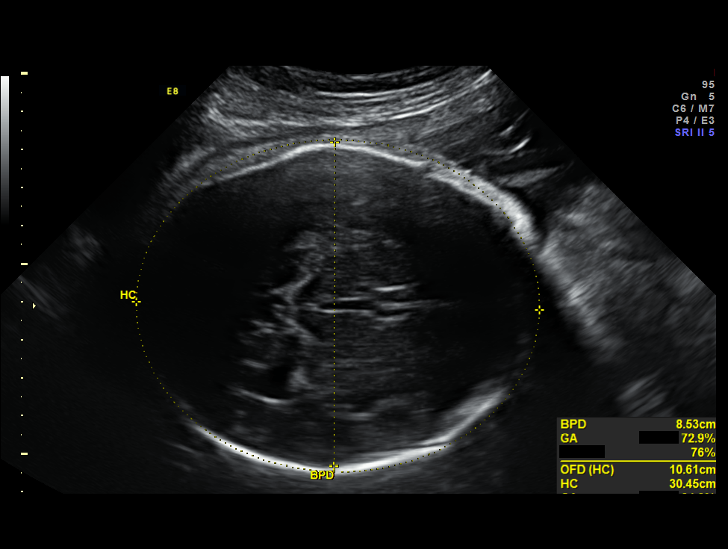
[im 19/35]
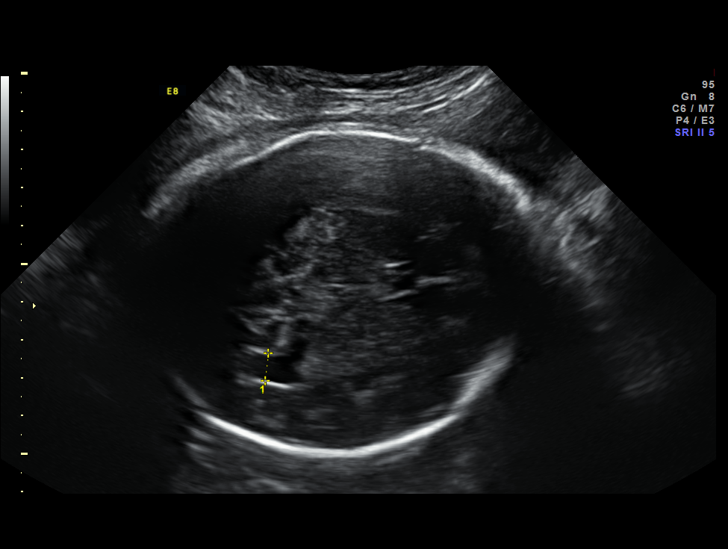
[im 22/35]
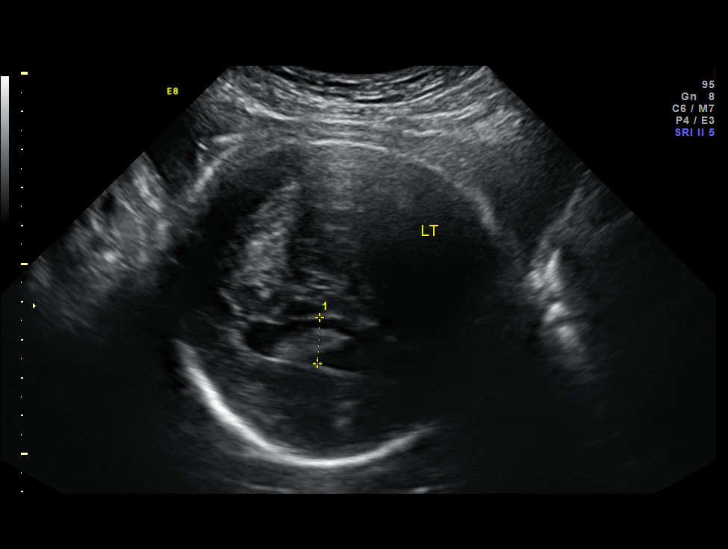
[im 24/35]
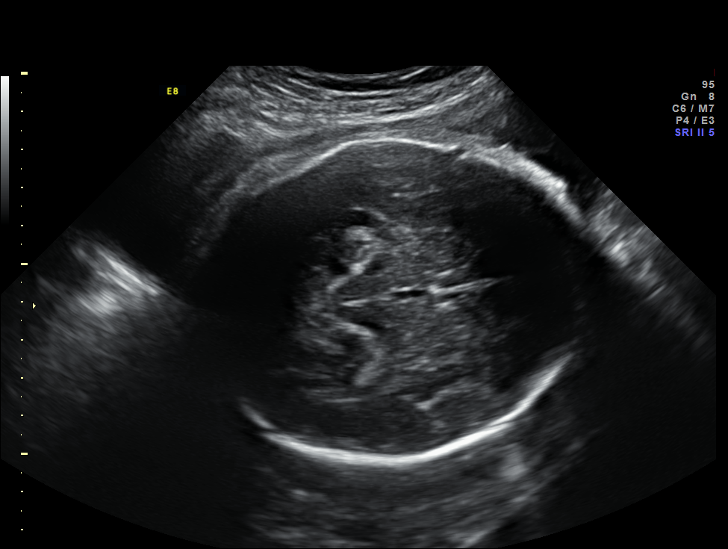
[im 27/35]
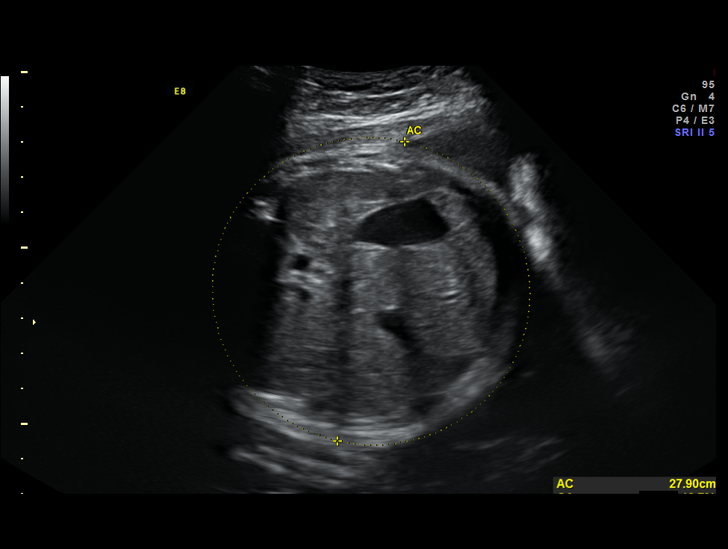
[im 29/35]
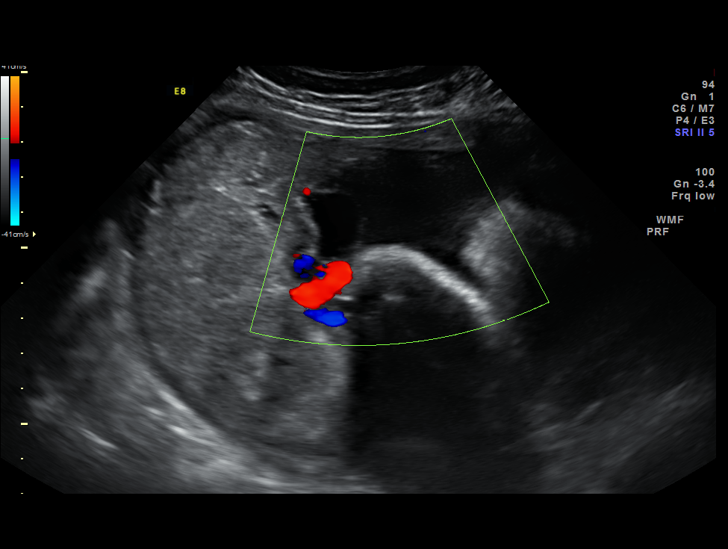
[im 32/35]
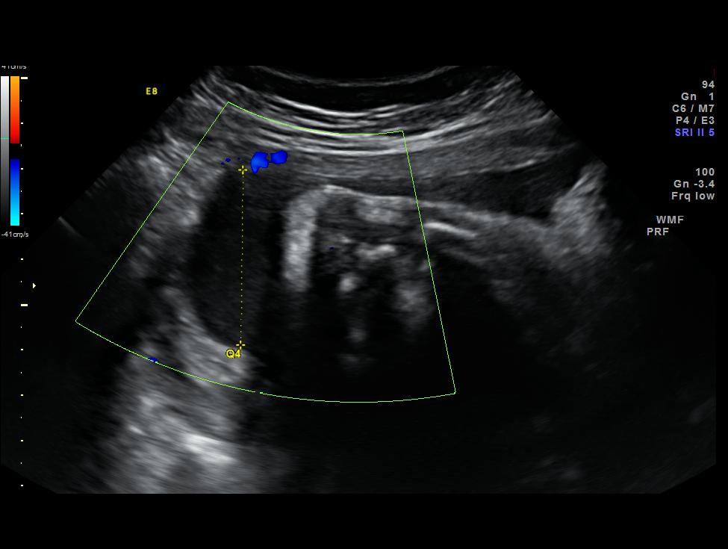
[im 35/35]
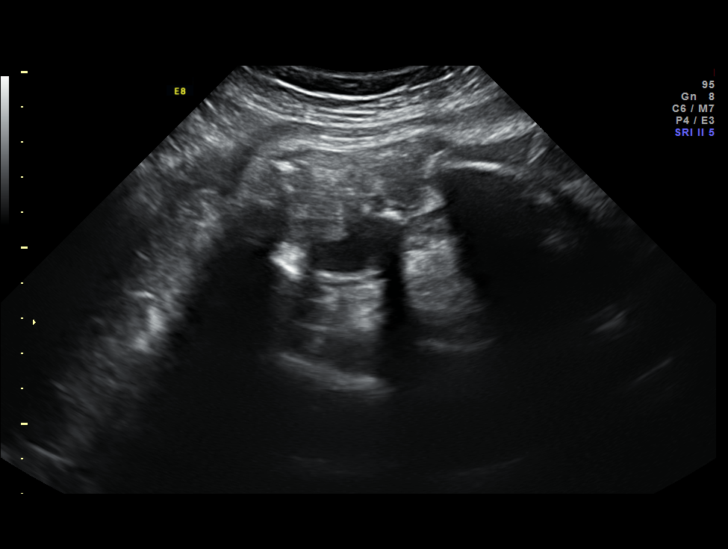

[14 of 28 positions shown; findings below may reference images not displayed]

Canned report from images found in remote index.

Refer to host system for actual result text.

## 2012-06-30 NOTE — Progress Notes (Signed)
Post discharge chart review completed.  

## 2014-02-11 ENCOUNTER — Encounter (HOSPITAL_COMMUNITY): Payer: Self-pay | Admitting: Obstetrics and Gynecology

## 2018-04-12 NOTE — L&D Delivery Note (Signed)
Pt presented to L&D in labor.She desired a TOLAC.She has signed a consent. She progressed along a nl labor curve. She pushed for 10 minutes and then had a SVD of one live viable female infant in the ROA position. Nuchal cord x 1. Placenta- S/I/ Second degree midline tear repaired with 3-0 chromic. Baby to NBN. EBL-400cc

## 2018-08-28 LAB — OB RESULTS CONSOLE ABO/RH: RH Type: POSITIVE

## 2018-08-28 LAB — OB RESULTS CONSOLE RUBELLA ANTIBODY, IGM: Rubella: IMMUNE

## 2018-08-28 LAB — OB RESULTS CONSOLE HIV ANTIBODY (ROUTINE TESTING): HIV: NONREACTIVE

## 2018-08-28 LAB — OB RESULTS CONSOLE VARICELLA ZOSTER ANTIBODY, IGG: Varicella: IMMUNE

## 2018-08-28 LAB — OB RESULTS CONSOLE GC/CHLAMYDIA
Chlamydia: NEGATIVE
Gonorrhea: NEGATIVE

## 2018-08-28 LAB — OB RESULTS CONSOLE RPR: RPR: NONREACTIVE

## 2018-08-28 LAB — OB RESULTS CONSOLE HEPATITIS B SURFACE ANTIGEN: Hepatitis B Surface Ag: NEGATIVE

## 2019-01-01 ENCOUNTER — Other Ambulatory Visit: Payer: Self-pay

## 2019-01-01 ENCOUNTER — Encounter: Payer: Managed Care, Other (non HMO) | Attending: Obstetrics and Gynecology | Admitting: Nutrition

## 2019-01-01 DIAGNOSIS — O24410 Gestational diabetes mellitus in pregnancy, diet controlled: Secondary | ICD-10-CM

## 2019-01-02 NOTE — Patient Instructions (Addendum)
1.Stop drinking milk and switch to vanilla flavored unsweetened almond milk 2.switch to brown rice and limit amounts at lunch and supper to 1 cup. 3.  Add protein to breakfast meal.   4.  Reduce fruit size at bedtime to 1/2 banana, 1/2 large apple, 12 grapes, or 3/4 cup of blueberries.   5.  If ok with gynecologist, walk for 30 min. Daily--may split this up to 10 min. 3X/day or all at once.  This is better done after a meal.   6. Continue to have greek yogurt for snacks during the day, and fruit with Almond milk at bedtime for now.   7.  Test blood sugars before breakfast, and 2hr. After eating breakfast, lunch and supper. 8.  Call the doctor if readings are above 90 before breakfast, and over 140 2 hours after eating a meal.

## 2019-01-02 NOTE — Progress Notes (Signed)
Patient is here with her interpreter to discuss her diagnosis, her diet and the need for SBGM.   We discussed what gestational diabetes is and how the baby can be affected by her elevated blood sugar levels.  We also discussed what makes her blood sugars go high, and the need to limit her intake of carbohydrates.   She reported good understanding of this.  Dietary history: 5AM:  Up 5:30AM: eating 5 ounces of plain white bread-no butter, and 16-20 ounces of whole milk-135 grams of carbohydrate. Occasionally eats cold cereal and milk-100grams od carbohydrate 8-9AM: hot pocket, with water 1PM: lunch: 3 cups of white rice with chicken- 135 grams of carb 3PM: 1 carton greek yogurt. 5:30-6PM: 3 cups of white rice with pork/chicken and vegetables.  Veg. Soup and water to drink 9-9:30 1 large banana (or other fruit:ie grapes, orange, large apple)  and 1 8 ounce glass of milk. 1.Stop drinking milk and switch to vanilla flavored unsweetened almond milk 2.switch to brown rice and limit amounts at lunch and supper to 1 cup. 3.  Add protein to breakfast meal.  Suggestions given for this--1-2 ounces  4.  Reduce fruit size at bedtime to 1/2 banana, 1/2 large apple, 12 grapes, or 3/4 cup of blueberries.   5.  If ok with gynecologist, walk for 30 min. Daily--may split this up to 10 min. 3X/day or all at once.  This is better done after a meal.   6. Continue to have greek yogurt for snacks during the day, and fruit with milk at bedtime for now.    We also discussed the need to test blood sugar, because her blood sugar levels will be rising as the pregnancy progresses.  She was given a One Touch Verio meter and shown how to test her blood sugar levels.  She re demonstrated this procedure without difficulty.  Her blood sugar was 90mg /dl 2 hours after breakfast. She was given a log and told to test acB and 2hr. pc meals.  Goals for readings:  AcB: less than 90, and 2hr. Pc: less than 140.   She was told to call the  doctor if any readings consistanttly are over those goals. She agreed to do this.   Return visit is suggested if blood sugar readings remain elevated and insulin is needed. Patient instructions were printed in [place] by the interpreter.

## 2019-01-16 ENCOUNTER — Ambulatory Visit: Payer: Managed Care, Other (non HMO) | Admitting: Registered"

## 2019-02-11 DIAGNOSIS — O24419 Gestational diabetes mellitus in pregnancy, unspecified control: Secondary | ICD-10-CM

## 2019-02-11 HISTORY — DX: Gestational diabetes mellitus in pregnancy, unspecified control: O24.419

## 2019-02-16 LAB — OB RESULTS CONSOLE GBS: GBS: NEGATIVE

## 2019-02-25 ENCOUNTER — Other Ambulatory Visit: Payer: Self-pay

## 2019-02-25 ENCOUNTER — Inpatient Hospital Stay (HOSPITAL_COMMUNITY)
Admission: AD | Admit: 2019-02-25 | Discharge: 2019-02-27 | DRG: 807 | Disposition: A | Discharge status: Home / Self Care | Payer: Managed Care, Other (non HMO) | Attending: Obstetrics and Gynecology | Admitting: Obstetrics and Gynecology

## 2019-02-25 ENCOUNTER — Encounter (HOSPITAL_COMMUNITY): Payer: Self-pay

## 2019-02-25 DIAGNOSIS — Z3490 Encounter for supervision of normal pregnancy, unspecified, unspecified trimester: Secondary | ICD-10-CM

## 2019-02-25 DIAGNOSIS — O34219 Maternal care for unspecified type scar from previous cesarean delivery: Principal | ICD-10-CM | POA: Diagnosis present

## 2019-02-25 DIAGNOSIS — Z3A37 37 weeks gestation of pregnancy: Secondary | ICD-10-CM

## 2019-02-25 DIAGNOSIS — Z20828 Contact with and (suspected) exposure to other viral communicable diseases: Secondary | ICD-10-CM | POA: Diagnosis present

## 2019-02-25 NOTE — MAU Note (Signed)
Pt here for contractions every 10 mins that started earlier this morning. Pt reports mucous plug came out. Denies vaginal bleeding. Reports good fetal movement. Cervix was 3cm last Friday.

## 2019-02-26 ENCOUNTER — Encounter (HOSPITAL_COMMUNITY): Payer: Self-pay

## 2019-02-26 ENCOUNTER — Other Ambulatory Visit: Payer: Self-pay

## 2019-02-26 DIAGNOSIS — Z20828 Contact with and (suspected) exposure to other viral communicable diseases: Secondary | ICD-10-CM | POA: Diagnosis present

## 2019-02-26 DIAGNOSIS — O34219 Maternal care for unspecified type scar from previous cesarean delivery: Secondary | ICD-10-CM | POA: Diagnosis present

## 2019-02-26 DIAGNOSIS — Z3490 Encounter for supervision of normal pregnancy, unspecified, unspecified trimester: Secondary | ICD-10-CM

## 2019-02-26 DIAGNOSIS — Z3A37 37 weeks gestation of pregnancy: Secondary | ICD-10-CM | POA: Diagnosis not present

## 2019-02-26 DIAGNOSIS — O26893 Other specified pregnancy related conditions, third trimester: Secondary | ICD-10-CM | POA: Diagnosis present

## 2019-02-26 LAB — CBC
HCT: 33.7 % — ABNORMAL LOW (ref 36.0–46.0)
Hemoglobin: 10.1 g/dL — ABNORMAL LOW (ref 12.0–15.0)
MCH: 22.5 pg — ABNORMAL LOW (ref 26.0–34.0)
MCHC: 30 g/dL (ref 30.0–36.0)
MCV: 75.2 fL — ABNORMAL LOW (ref 80.0–100.0)
Platelets: 366 10*3/uL (ref 150–400)
RBC: 4.48 MIL/uL (ref 3.87–5.11)
RDW: 16.8 % — ABNORMAL HIGH (ref 11.5–15.5)
WBC: 14.7 10*3/uL — ABNORMAL HIGH (ref 4.0–10.5)
nRBC: 0.2 % (ref 0.0–0.2)

## 2019-02-26 LAB — TYPE AND SCREEN
ABO/RH(D): B POS
Antibody Screen: NEGATIVE

## 2019-02-26 LAB — RPR: RPR Ser Ql: NONREACTIVE

## 2019-02-26 LAB — SARS CORONAVIRUS 2 (TAT 6-24 HRS): SARS Coronavirus 2: NEGATIVE

## 2019-02-26 LAB — ABO/RH: ABO/RH(D): B POS

## 2019-02-26 MED ORDER — TERBUTALINE SULFATE 1 MG/ML IJ SOLN: 0.2500 mg | Freq: Once | INTRAMUSCULAR | Status: DC | PRN

## 2019-02-26 MED ORDER — FENTANYL CITRATE (PF) 100 MCG/2ML IJ SOLN
100.0000 ug | INTRAMUSCULAR | Status: DC | PRN
  Administered 2019-02-26 (×2): 100 ug via INTRAVENOUS
  Filled 2019-02-26 (×2): qty 2

## 2019-02-26 MED ORDER — ACETAMINOPHEN 325 MG PO TABS: 650.0000 mg | ORAL_TABLET | ORAL | Status: DC | PRN

## 2019-02-26 MED ORDER — LACTATED RINGERS IV SOLN
INTRAVENOUS | Status: DC
  Administered 2019-02-26: 01:00:00 via INTRAVENOUS

## 2019-02-26 MED ORDER — BENZOCAINE-MENTHOL 20-0.5 % EX AERO
1.0000 "application " | INHALATION_SPRAY | CUTANEOUS | Status: DC | PRN
  Administered 2019-02-26: 1 via TOPICAL
  Filled 2019-02-26: qty 56

## 2019-02-26 MED ORDER — OXYTOCIN 40 UNITS IN NORMAL SALINE INFUSION - SIMPLE MED
2.5000 [IU]/h | INTRAVENOUS | Status: DC
  Filled 2019-02-26: qty 1000

## 2019-02-26 MED ORDER — COCONUT OIL OIL: 1.0000 "application " | TOPICAL_OIL | Status: DC | PRN

## 2019-02-26 MED ORDER — LACTATED RINGERS IV SOLN: 500.0000 mL | INTRAVENOUS | Status: DC | PRN

## 2019-02-26 MED ORDER — ONDANSETRON HCL 4 MG/2ML IJ SOLN: 4.0000 mg | Freq: Four times a day (QID) | INTRAMUSCULAR | Status: DC | PRN

## 2019-02-26 MED ORDER — SIMETHICONE 80 MG PO CHEW: 80.0000 mg | CHEWABLE_TABLET | ORAL | Status: DC | PRN

## 2019-02-26 MED ORDER — FLEET ENEMA 7-19 GM/118ML RE ENEM: 1.0000 | ENEMA | Freq: Every day | RECTAL | Status: DC | PRN

## 2019-02-26 MED ORDER — TETANUS-DIPHTH-ACELL PERTUSSIS 5-2.5-18.5 LF-MCG/0.5 IM SUSP: 0.5000 mL | Freq: Once | INTRAMUSCULAR | Status: DC

## 2019-02-26 MED ORDER — SENNOSIDES-DOCUSATE SODIUM 8.6-50 MG PO TABS
2.0000 | ORAL_TABLET | ORAL | Status: DC
  Administered 2019-02-26: 2 via ORAL
  Filled 2019-02-26: qty 2

## 2019-02-26 MED ORDER — IBUPROFEN 600 MG PO TABS
600.0000 mg | ORAL_TABLET | Freq: Four times a day (QID) | ORAL | Status: DC
  Administered 2019-02-26 – 2019-02-27 (×5): 600 mg via ORAL
  Filled 2019-02-26 (×5): qty 1

## 2019-02-26 MED ORDER — MEASLES, MUMPS & RUBELLA VAC IJ SOLR: 0.5000 mL | Freq: Once | INTRAMUSCULAR | Status: DC

## 2019-02-26 MED ORDER — ONDANSETRON HCL 4 MG/2ML IJ SOLN: 4.0000 mg | INTRAMUSCULAR | Status: DC | PRN

## 2019-02-26 MED ORDER — DIBUCAINE (PERIANAL) 1 % EX OINT: 1.0000 "application " | TOPICAL_OINTMENT | CUTANEOUS | Status: DC | PRN

## 2019-02-26 MED ORDER — OXYCODONE-ACETAMINOPHEN 5-325 MG PO TABS: 1.0000 | ORAL_TABLET | ORAL | Status: DC | PRN

## 2019-02-26 MED ORDER — WITCH HAZEL-GLYCERIN EX PADS: 1.0000 "application " | MEDICATED_PAD | CUTANEOUS | Status: DC | PRN

## 2019-02-26 MED ORDER — LIDOCAINE HCL (PF) 1 % IJ SOLN
30.0000 mL | INTRAMUSCULAR | Status: AC | PRN
  Administered 2019-02-26: 30 mL via SUBCUTANEOUS
  Filled 2019-02-26: qty 30

## 2019-02-26 MED ORDER — OXYCODONE-ACETAMINOPHEN 5-325 MG PO TABS: 2.0000 | ORAL_TABLET | ORAL | Status: DC | PRN

## 2019-02-26 MED ORDER — ONDANSETRON HCL 4 MG PO TABS: 4.0000 mg | ORAL_TABLET | ORAL | Status: DC | PRN

## 2019-02-26 MED ORDER — OXYTOCIN BOLUS FROM INFUSION
500.0000 mL | Freq: Once | INTRAVENOUS | Status: AC
  Administered 2019-02-26: 500 mL via INTRAVENOUS

## 2019-02-26 MED ORDER — SOD CITRATE-CITRIC ACID 500-334 MG/5ML PO SOLN: 30.0000 mL | ORAL | Status: DC | PRN

## 2019-02-26 MED ORDER — ZOLPIDEM TARTRATE 5 MG PO TABS: 5.0000 mg | ORAL_TABLET | Freq: Every evening | ORAL | Status: DC | PRN

## 2019-02-26 MED ORDER — OXYTOCIN 40 UNITS IN NORMAL SALINE INFUSION - SIMPLE MED
1.0000 m[IU]/min | INTRAVENOUS | Status: DC
  Administered 2019-02-26: 2 m[IU]/min via INTRAVENOUS

## 2019-02-26 NOTE — H&P (Signed)
33 y.o. [redacted]w[redacted]d  G3P1101 comes in c/o ctx.  Otherwise has good fetal movement and no bleeding.  She has history of <6# FT delivery 2012, f/b preterm 38 c/s for breech 2014.  Past Medical History:  Diagnosis Date  . Medical history non-contributory     Past Surgical History:  Procedure Laterality Date  . CESAREAN SECTION N/A 06/26/2012   Procedure:  Primary cesarean section with delivery of baby boy at 22. Apgars 9/9.;  Surgeon: Oliver Pila, MD;  Location: [facility];  Service: Obstetrics;  Laterality: N/A;  . NO PAST SURGERIES      OB History  Gravida Para Term Preterm AB Living  3 2 1 1   1   SAB TAB Ectopic Multiple Live Births               # Outcome Date GA Lbr Len/2nd Weight Sex Delivery Anes PTL Lv  3 Current           2 Preterm 06/26/12 [redacted]w[redacted]d  2634 g M CS-LTranv Spinal    1 Term 08/11/10            Social History   Socioeconomic History  . Marital status: Married    Spouse name: Not on file  . Number of children: Not on file  . Years of education: Not on file  . Highest education level: Not on file  Occupational History  . Not on file  Social Needs  . Financial resource strain: Not on file  . Food insecurity    Worry: Not on file    Inability: Not on file  . Transportation needs    Medical: Not on file    Non-medical: Not on file  Tobacco Use  . Smoking status: Never Smoker  . Smokeless tobacco: Never Used  Substance and Sexual Activity  . Alcohol use: No  . Drug use: No  . Sexual activity: Yes  Lifestyle  . Physical activity    Days per week: Not on file    Minutes per session: Not on file  . Stress: Not on file  Relationships  . Social 10/11/10 on phone: Not on file    Gets together: Not on file    Attends religious service: Not on file    Active member of club or organization: Not on file    Attends meetings of clubs or organizations: Not on file    Relationship status: Not on file  . Intimate partner violence    Fear of current or  ex partner: Not on file    Emotionally abused: Not on file    Physically abused: Not on file    Forced sexual activity: Not on file  Other Topics Concern  . Not on file  Social History Narrative  . Not on file   Patient has no known allergies.    Prenatal Transfer Tool  Maternal Diabetes: Yes:  Diabetes Type:  Diet controlled Genetic Screening: Normal Maternal Ultrasounds/Referrals: Normal Fetal Ultrasounds or other Referrals:  None Maternal Substance Abuse:  No Significant Maternal Medications:  None Significant Maternal Lab Results: Group B Strep negative  Other PNC: GDMA1 well controlled, TOLAC desired, consent signed.    Vitals:   02/25/19 2242 02/26/19 0137 02/26/19 0141 02/26/19 0538  BP: 118/79  129/87 117/76  Pulse: 99  91 89  Resp: 16  16 16   Temp:   99 F (37.2 C) 98.7 F (37.1 C)  TempSrc:   Oral Oral  SpO2: 100%  Weight:  53 kg    Height:  4\' 8"  (1.422 m)      PE: SVE:  5/70/-2 at admission now 8/90/-1 FHTs:  140, good STV, NST R; Cat 1 tracing. Toco:  q 2-7   A/P   Admitted for TOLAC with labor at term  GBS Neg  IV pain/epidural if desired  Routine care    Allyn Kenner

## 2019-02-26 NOTE — Lactation Note (Signed)
This note was copied from a baby's chart. Lactation Consultation Note  Patient Name: Tanya Nielsen SEGBT'D Date: 02/26/2019 Reason for consult: Initial assessment;Early term 37-38.6wks;Infant < 6lbs  P2 mother whose infant is now 7 hours old.  This is an ETI at 37+6 weeks weighing < 6 lbs.  Mother breast fed her first child (now 31 years old) for 6 weeks.  Her feeding preference on admission was breast/bottle.  Baby was asleep in the bassinet when I arrived.  Reviewed the [place] with mother.  Discussed the feeding supplementation guidelines.  Asked mother to feed 8-12 times/24 hours or sooner if baby shows feeding cues.  Reviewed cues.  Explained the early gestational age and the birthweight to emphasize the importance of regular feedings.  Suggested mother begin pumping with the DEBP for breast stimulation and to provide her EBM for supplementation.  Mother willing to begin pumping.  Pump parts, assembly, disassembly and cleaning reviewed.    Mother's breasts are soft and non tender and nipples are everted and intact.  #24 flange size is appropriate at this time.  However, I cautioned mother to continue assessing flange size because I believe after a few pumping sessions she may need to increase to the #27.  Mother verbalized understanding.  Observed mother pumping for about 5 minutes while discussing breast feeding basics.  Baby has already had one bottle of Similac 22 and consumed 15 mls prior to my arrival.  Mother will be returning to work in 6-8 weeks and will continue to breast/bottle feed.    Mom made aware of O/P services, breastfeeding support groups, community resources, and our phone # for post-discharge questions.  Father present.   Maternal Data Formula Feeding for Exclusion: Yes Reason for exclusion: Mother's choice to formula and breast feed on admission Has patient been taught Hand Expression?: Yes Does the patient have breastfeeding experience prior to this delivery?:  Yes  Feeding    LATCH Score                   Interventions    Lactation Tools Discussed/Used Pump Review: Setup, frequency, and cleaning;Milk Storage Initiated by:: Tanya Nielsen Date initiated:: 02/26/19   Consult Status Consult Status: Follow-up Date: 02/27/19 Follow-up type: In-patient    Tanya Nielsen R Tanya Nielsen 02/26/2019, 3:24 PM

## 2019-02-26 NOTE — Plan of Care (Signed)
Tanya Martello, RN 

## 2019-02-26 NOTE — MAU Note (Signed)
Patient ambulating in room

## 2019-02-27 LAB — CBC
HCT: 25.8 % — ABNORMAL LOW (ref 36.0–46.0)
Hemoglobin: 8 g/dL — ABNORMAL LOW (ref 12.0–15.0)
MCH: 22.4 pg — ABNORMAL LOW (ref 26.0–34.0)
MCHC: 31 g/dL (ref 30.0–36.0)
MCV: 72.3 fL — ABNORMAL LOW (ref 80.0–100.0)
Platelets: 285 10*3/uL (ref 150–400)
RBC: 3.57 MIL/uL — ABNORMAL LOW (ref 3.87–5.11)
RDW: 16.8 % — ABNORMAL HIGH (ref 11.5–15.5)
WBC: 16.8 10*3/uL — ABNORMAL HIGH (ref 4.0–10.5)
nRBC: 0 % (ref 0.0–0.2)

## 2019-02-27 MED ORDER — FERROUS SULFATE 325 (65 FE) MG PO TABS
325.0000 mg | ORAL_TABLET | Freq: Every day | ORAL | Status: DC
  Administered 2019-02-27: 325 mg via ORAL
  Filled 2019-02-27: qty 1

## 2019-02-27 MED ORDER — FERROUS SULFATE 325 (65 FE) MG PO TABS: 325.0000 mg | ORAL_TABLET | Freq: Every day | ORAL | 3 refills | Status: DC

## 2019-02-27 NOTE — Discharge Summary (Signed)
Obstetric Discharge Summary Reason for Admission: onset of labor Prenatal Procedures: none Intrapartum Procedures: spontaneous vaginal delivery Postpartum Procedures: none Complications-Operative and Postpartum: 2 degree perineal laceration Hemoglobin  Date Value Ref Range Status  02/27/2019 8.0 (L) 12.0 - 15.0 g/dL Final    Comment:    Reticulocyte Hemoglobin testing may be clinically indicated, consider ordering this additional test [id number]    HCT  Date Value Ref Range Status  02/27/2019 25.8 (L) 36.0 - 46.0 % Final     Discharge Diagnoses: Term Pregnancy-delivered  Discharge Information: Date: 02/27/2019 Activity: pelvic rest Diet: routine Medications: Ibuprofen and Iron Condition: stable Instructions: refer to practice specific booklet Discharge to: home Follow-up Information    Tanya Millers, MD Follow up in 4 week(s).   Specialty: Obstetrics and Gynecology Contact information: [place] [phone]           Newborn Data: Live born female  Birth Weight: 5 lb 12.2 oz (2614 g) APGAR: 30, 9  Newborn Delivery   Birth date/time: 02/26/2019 08:36:00 Delivery type: Vaginal, Spontaneous      Home with mother.  Tanya Nielsen 02/27/2019, 7:45 AM

## 2019-02-27 NOTE — Lactation Note (Signed)
This note was copied from a baby's chart. Lactation Consultation Note  Patient Name: Tanya Nielsen XIPJA'S Date: 02/27/2019 Reason for consult: Follow-up assessment;Early term 37-38.6wks;Infant < [place] interpreter - [place] - [phone]  Baby is 43 hours old.  2 % weight loss, @ 25 hours old 4.7  Per mom has breast fed 3 times in the last 24 hours, supplemented, and post pump x 4 without results as of yet.  LC reviewed hand expressing and assessed breast tissue for soreness.  Per mom nipples are alittle tender starting this am. LC recommended and encouraged  Mom to work on hand expressing, apply to her nipples.  LC also asked the [place] to provide coconut oil for mom due to dryness on the nipples.  Sore nipple and engorgement prevention and tx reviewed.  Per mom will have a DEB{ Spectra at home.  LC reviewed Early term, less than 6 pounds recommended feeding plan.  Breast feeding 8 -12 times in 24 hours for 15 -20 mins , 30 mins max and then supplement with EBM or formula ( today at least 20 ml and by 48 hours 30 ml )  Until the baby is over 6 pounds and gaining steadily to continue recommended  Feeding plan. If the baby is wide awake and hungry after feeding the 1st breast  Offer the 2nd breast 1-2 feedings a day.  LC stressed the supplementing is free calories the baby doesn't have to work hard for and it will enhance growth.  LC stressed STS feedings until the baby is back to birth weight, gaining steadily.  Feed with feeding cues and definitely by 3-4 hours.  Mom has extra diary sheets to write down I/O 's and [facility] pamphlet with phone  Numbers.   Maternal Data    Feeding Feeding Type: (per mom last fed at 1000)  LATCH Score                   Interventions Interventions: Breast feeding basics reviewed  Lactation Tools Discussed/Used Tools: Pump;Flanges Flange Size: 24;27 Breast pump type: Double-Electric Breast Pump WIC Program: No Pump  Review: Milk Storage   Consult Status Consult Status: Complete Date: 02/27/19    Myer Haff 02/27/2019, 12:24 PM

## 2019-02-27 NOTE — Progress Notes (Signed)
CSW informed by RN that [name] was in need of a Baby Box and potential carseat. CSW met with [name] and [name] at bedside to assess for needs and deliver Baby Box. CSW received permission to look at carseat to see if it had expired. CSW noted manufacturer date was April 01, 1999. CSW provided [name] and [name] with new carseat through hospital car seat program. [name] and [name] very appreciative of assistance.  Tanya Nielsen, [place]  [facility] [employer] [phone]

## 2019-02-27 NOTE — Progress Notes (Signed)
Patient is eating, ambulating, voiding.  Pain control is good.  Vitals:   02/26/19 1610 02/26/19 1952 02/26/19 2348 02/27/19 0512  BP: 108/74 103/66 91/68 (!) 91/58  Pulse: 98 89 89 94  Resp: 19 18 16 16   Temp: 98.1 F (36.7 C) 98.4 F (36.9 C) 98.1 F (36.7 C) 98.1 F (36.7 C)  TempSrc: Oral Oral Oral Oral  SpO2:  98% 99% 99%  Weight:      Height:        Fundus firm Perineum without swelling.  Lab Results  Component Value Date   WBC 16.8 (H) 02/27/2019   HGB 8.0 (L) 02/27/2019   HCT 25.8 (L) 02/27/2019   MCV 72.3 (L) 02/27/2019   PLT 285 02/27/2019    --/--/B POS, B POS Performed at [facility], [place] [address], [place], [place] [address]  [phone])/RI  A/P Post partum day 1.  Routine care.  Expect d/c routine.    Daria Pastures

## 2020-07-16 ENCOUNTER — Encounter (HOSPITAL_COMMUNITY): Payer: Self-pay | Admitting: Emergency Medicine

## 2020-07-16 ENCOUNTER — Ambulatory Visit (HOSPITAL_COMMUNITY)
Admission: EM | Admit: 2020-07-16 | Discharge: 2020-07-16 | Disposition: A | Discharge status: Home / Self Care | Payer: Managed Care, Other (non HMO) | Attending: Emergency Medicine | Admitting: Emergency Medicine

## 2020-07-16 ENCOUNTER — Other Ambulatory Visit: Payer: Self-pay

## 2020-07-16 DIAGNOSIS — R519 Headache, unspecified: Secondary | ICD-10-CM | POA: Diagnosis not present

## 2020-07-16 DIAGNOSIS — T148XXA Other injury of unspecified body region, initial encounter: Secondary | ICD-10-CM | POA: Diagnosis not present

## 2020-07-16 MED ORDER — NAPROXEN 500 MG PO TABS: 500.0000 mg | ORAL_TABLET | Freq: Two times a day (BID) | ORAL | 0 refills | Status: AC

## 2020-07-16 MED ORDER — TIZANIDINE HCL 4 MG PO TABS: 4.0000 mg | ORAL_TABLET | Freq: Four times a day (QID) | ORAL | 0 refills | Status: DC | PRN

## 2020-07-16 NOTE — ED Triage Notes (Signed)
Pt presents with headache and left breast pain that radiates into left arm xs 2-3 days. States has been taking tylenol for pain.

## 2020-07-16 NOTE — ED Provider Notes (Signed)
[facility]    CSN: [id number] Arrival date & time: 07/16/20  1007      History   Chief Complaint Chief Complaint  Patient presents with  . Breast Pain    Left  . Headache    HPI Tanya Nielsen is a 35 y.o. female.   Is here for evaluation of left breast pain and headaches that have been ongoing for the past 2 to 3 days.  Reports left breast pain radiates into left arm.  Pain is worse with movement.  Reports having similar pain in the past that was relieved by Tylenol.  Reports taking Tylenol with minimal relief.  Denies any redness, swelling, or pain in her armpit.  Denies any dimpling or discharge from her nipple.  Denies any fevers, chest pain, shortness of breath, N/V/D, numbness, tingling, weakness, abdominal pain, or headaches.   ROS: As per HPI, all other pertinent ROS negative   The history is provided by the patient.  Headache   Past Medical History:  Diagnosis Date  . Medical history non-contributory     Patient Active Problem List   Diagnosis Date Noted  . Pregnancy 02/26/2019    Past Surgical History:  Procedure Laterality Date  . CESAREAN SECTION N/A 06/26/2012   Procedure:  Primary cesarean section with delivery of baby boy at 73. Apgars 9/9.;  Surgeon: Oliver Pila, MD;  Location: [facility];  Service: Obstetrics;  Laterality: N/A;  . NO PAST SURGERIES      OB History    Gravida  3   Para  3   Term  2   Preterm  1   AB      Living  2     SAB      IAB      Ectopic      Multiple  0   Live Births  1            Home Medications    Prior to Admission medications   Medication Sig Start Date End Date Taking? Authorizing Provider  naproxen (NAPROSYN) 500 MG tablet Take 1 tablet (500 mg total) by mouth 2 (two) times daily for 7 days. 07/16/20 07/23/20 Yes Ivette Loyal, NP  tiZANidine (ZANAFLEX) 4 MG tablet Take 1 tablet (4 mg total) by mouth every 6 (six) hours as needed for muscle spasms. 07/16/20  Yes Ivette Loyal, NP   ferrous sulfate 325 (65 FE) MG tablet Take 1 tablet (325 mg total) by mouth daily with breakfast. 02/27/19   Carrington Clamp, MD  tetrahydrozoline-zinc (VISINE-AC) 0.05-0.25 % ophthalmic solution Place 1 drop into the left eye daily as needed (For dryness).    [provider]    Family History History reviewed. No pertinent family history.  Social History Social History   Tobacco Use  . Smoking status: Never Smoker  . Smokeless tobacco: Never Used  Substance Use Topics  . Alcohol use: No  . Drug use: No     Allergies   Patient has no known allergies.   Review of Systems Review of Systems  Neurological: Positive for headaches.     Physical Exam Triage Vital Signs ED Triage Vitals  Enc Vitals Group     BP 07/16/20 1027 99/73     Pulse Rate 07/16/20 1027 (!) 108     Resp 07/16/20 1027 16     Temp 07/16/20 1027 99.4 F (37.4 C)     Temp Source 07/16/20 1027 Oral     SpO2  07/16/20 1027 99 %     Weight --      Height --      Head Circumference --      Peak Flow --      Pain Score 07/16/20 1026 7     Pain Loc --      Pain Edu? --      Excl. in GC? --    No data found.  Updated Vital Signs BP 99/73 (BP Location: Right Arm)   Pulse (!) 108   Temp 99.4 F (37.4 C) (Oral)   Resp 16   LMP  (LMP Unknown)   SpO2 99%   Visual Acuity Right Eye Distance:   Left Eye Distance:   Bilateral Distance:    Right Eye Near:   Left Eye Near:    Bilateral Near:     Physical Exam Vitals and nursing note reviewed.  Constitutional:      General: She is not in acute distress.    Appearance: Normal appearance. She is not ill-appearing, toxic-appearing or diaphoretic.  HENT:     Head: Normocephalic and atraumatic.  Eyes:     Conjunctiva/sclera: Conjunctivae normal.  Cardiovascular:     Rate and Rhythm: Normal rate.     Pulses: Normal pulses.  Pulmonary:     Effort: Pulmonary effort is normal.  Chest:     Chest wall: No mass or lacerations.  Breasts:  Breasts are symmetrical.     Right: Normal.     Left: Tenderness present. No swelling, bleeding, inverted nipple, mass, nipple discharge or skin change.    Abdominal:     General: Abdomen is flat.  Musculoskeletal:        General: Normal range of motion.     Cervical back: Normal range of motion.  Skin:    General: Skin is warm and dry.  Neurological:     General: No focal deficit present.     Mental Status: She is alert and oriented to person, place, and time.  Psychiatric:        Mood and Affect: Mood normal.      UC Treatments / Results  Labs (all labs ordered are listed, but only abnormal results are displayed) Labs Reviewed - No data to display  EKG   Radiology No results found.  Procedures Procedures (including critical care time)  Medications Ordered in UC Medications - No data to display  Initial Impression / Assessment and Plan / UC Course  I have reviewed the triage vital signs and the nursing notes.  Pertinent labs & imaging results that were available during my care of the patient were reviewed by me and considered in my medical decision making (see chart for details).     Muscle strain Assessment negative for red flags or concerns, including breast masses  Will treat for muscle strain with naproxen twice daily x1 week and Zanaflex as needed.  Encouraged rest.  Alternate heat and ice for comfort. Get established and follow-up with primary care provider if symptoms do not improve in the next few weeks. If symptoms worsen or you feel a lump in the left breast please return for reevaluation.   Final Clinical Impressions(s) / UC Diagnoses   Final diagnoses:  Acute nonintractable headache, unspecified headache type  Muscle strain     Discharge Instructions     Take the Naproxen twice a day for the next week.  You can also use the Zanaflex as needed for muscle strain and spasms.  Rest.  Alternate use of heat and ice for comfort.   Follow up  with your primary care doctor if symptoms do not improve in the next few weeks.      ED Prescriptions    Medication Sig Dispense Auth. Provider   tiZANidine (ZANAFLEX) 4 MG tablet Take 1 tablet (4 mg total) by mouth every 6 (six) hours as needed for muscle spasms. 30 tablet Ivette Loyal, NP   naproxen (NAPROSYN) 500 MG tablet Take 1 tablet (500 mg total) by mouth 2 (two) times daily for 7 days. 30 tablet Ivette Loyal, NP     PDMP not reviewed this encounter.   Ivette Loyal, NP 07/16/20 1110

## 2020-07-16 NOTE — Discharge Instructions (Addendum)
Take the Naproxen twice a day for the next week.  You can also use the Zanaflex as needed for muscle strain and spasms.    Rest.  Alternate use of heat and ice for comfort.   Follow up with your primary care doctor if symptoms do not improve in the next few weeks.

## 2021-09-20 ENCOUNTER — Ambulatory Visit (HOSPITAL_COMMUNITY)
Admission: EM | Admit: 2021-09-20 | Discharge: 2021-09-20 | Disposition: A | Discharge status: Home / Self Care | Payer: No Typology Code available for payment source | Attending: Physician Assistant | Admitting: Physician Assistant

## 2021-09-20 ENCOUNTER — Encounter (HOSPITAL_COMMUNITY): Payer: Self-pay | Admitting: Emergency Medicine

## 2021-09-20 DIAGNOSIS — M79602 Pain in left arm: Secondary | ICD-10-CM

## 2021-09-20 DIAGNOSIS — L02412 Cutaneous abscess of left axilla: Secondary | ICD-10-CM

## 2021-09-20 MED ORDER — SULFAMETHOXAZOLE-TRIMETHOPRIM 800-160 MG PO TABS: 1.0000 | ORAL_TABLET | Freq: Two times a day (BID) | ORAL | 0 refills | Status: AC

## 2021-09-20 MED ORDER — IBUPROFEN 800 MG PO TABS: 800.0000 mg | ORAL_TABLET | Freq: Three times a day (TID) | ORAL | 0 refills | Status: DC

## 2021-09-20 MED ORDER — LIDOCAINE HCL (PF) 1 % IJ SOLN
INTRAMUSCULAR | Status: AC
  Filled 2021-09-20: qty 30

## 2021-09-20 NOTE — ED Provider Notes (Signed)
[facility]    CSN: [id number] Arrival date & time: 09/20/21  1131      History   Chief Complaint Chief Complaint  Patient presents with   Abscess    HPI Tanya Nielsen is a 36 y.o. female.   36 year old presents with redness under left arm.  Patient relates that she has a history of having a recurrent cyst under the left arm at the axilla.  Patient relates she she had a similar episode occur last year and the area had to be drained.  Patient relates for the past couple weeks she has had intermittent tenderness underneath the left arm, this is became worse over the past couple days, with increased redness and pain.  Patient relates she was taking Advil but this tends to not give her relief now.  Patient indicates that the area is red and irritated.  Patient indicates that she has not noticed any drainage from the area.  Patient has not had any fever or chills.     Abscess   Past Medical History:  Diagnosis Date   Medical history non-contributory     Patient Active Problem List   Diagnosis Date Noted   Pregnancy 02/26/2019    Past Surgical History:  Procedure Laterality Date   CESAREAN SECTION N/A 06/26/2012   Procedure:  Primary cesarean section with delivery of baby boy at 63. Apgars 9/9.;  Surgeon: Oliver Pila, MD;  Location: [facility];  Service: Obstetrics;  Laterality: N/A;   NO PAST SURGERIES      OB History     Gravida  3   Para  3   Term  2   Preterm  1   AB      Living  2      SAB      IAB      Ectopic      Multiple  0   Live Births  1            Home Medications    Prior to Admission medications   Medication Sig Start Date End Date Taking? Authorizing Provider  ibuprofen (ADVIL) 800 MG tablet Take 1 tablet (800 mg total) by mouth 3 (three) times daily. 09/20/21  Yes Ellsworth Lennox, PA-C  sulfamethoxazole-trimethoprim (BACTRIM DS) 800-160 MG tablet Take 1 tablet by mouth 2 (two) times daily for 7 days. 09/20/21 09/27/21 Yes  Ellsworth Lennox, PA-C  ferrous sulfate 325 (65 FE) MG tablet Take 1 tablet (325 mg total) by mouth daily with breakfast. 02/27/19   Carrington Clamp, MD  tetrahydrozoline-zinc (VISINE-AC) 0.05-0.25 % ophthalmic solution Place 1 drop into the left eye daily as needed (For dryness).    [provider]  tiZANidine (ZANAFLEX) 4 MG tablet Take 1 tablet (4 mg total) by mouth every 6 (six) hours as needed for muscle spasms. 07/16/20   Ivette Loyal, NP    Family History No family history on file.  Social History Social History   Tobacco Use   Smoking status: Never   Smokeless tobacco: Never  Substance Use Topics   Alcohol use: No   Drug use: No     Allergies   Patient has no known allergies.   Review of Systems Review of Systems  Skin:  Positive for wound (cyst left axilla).     Physical Exam Triage Vital Signs ED Triage Vitals  Enc Vitals Group     BP 09/20/21 1233 (!) 177/77     Pulse Rate 09/20/21 1233 93  Resp 09/20/21 1233 17     Temp 09/20/21 1233 98.5 F (36.9 C)     Temp Source 09/20/21 1233 Oral     SpO2 09/20/21 1233 100 %     Weight --      Height --      Head Circumference --      Peak Flow --      Pain Score 09/20/21 1231 9     Pain Loc --      Pain Edu? --      Excl. in GC? --    No data found.  Updated Vital Signs BP (!) 177/77 (BP Location: Right Arm)   Pulse 93   Temp 98.5 F (36.9 C) (Oral)   Resp 17   SpO2 100%   Visual Acuity Right Eye Distance:   Left Eye Distance:   Bilateral Distance:    Right Eye Near:   Left Eye Near:    Bilateral Near:     Physical Exam Constitutional:      Appearance: Normal appearance.  Skin:    Comments: Left axilla: There is a large 3-1/2 x 2-1/2 cm axilla cyst present with redness associated.  The cyst has fluid consistency. Procedure: The area was cleaned with Betadine, anesthetized with 1% Xylocaine, incision was made, and purulent material expressed.  There were no complications.  This is  a lobulated cystic formation with several different compartments present, the I&D that was performed evacuated some of the material however there is still considerable amount of material left in the different pockets.  The cyst should be removed to prevent recurrence.  Neurological:     Mental Status: She is alert.      UC Treatments / Results  Labs (all labs ordered are listed, but only abnormal results are displayed) Labs Reviewed - No data to display  EKG   Radiology No results found.  Procedures Procedures (including critical care time)  Medications Ordered in UC Medications - No data to display  Initial Impression / Assessment and Plan / UC Course  I have reviewed the triage vital signs and the nursing notes.  Pertinent labs & imaging results that were available during my care of the patient were reviewed by me and considered in my medical decision making (see chart for details).    Plan: 1.  Advised the patient to take ibuprofen 800 mg every 8 hours with food to decrease the pain. 2.  Advised the patient to take the Bactrim DS 1 every 12 hours to treat infection. 3.  Patient has been internally referred to a surgeon for evaluation and possible cystic capsule removal. 4.  Patient has been advised follow-up PCP or return to urgent care if symptoms fail to improve. Final Clinical Impressions(s) / UC Diagnoses   Final diagnoses:  Abscess of axilla, left  Pain in left arm     Discharge Instructions      Advised to use warm compresses to area frequently. Advised to take ibuprofen 800 mg 1 every 8 hours to help reduce the pain and swelling. I have requested an internal surgical referral to have the area evaluated and the cyst possibly removed. The surgical group should be calling you within the next 48 hours to arrange an appointment for you to be seen and the cyst area evaluated. Advised to follow-up with PCP or return to urgent care if symptoms fail to  improve     ED Prescriptions     Medication Sig Dispense Auth. Provider  ibuprofen (ADVIL) 800 MG tablet Take 1 tablet (800 mg total) by mouth 3 (three) times daily. 21 tablet Ellsworth LennoxJames, Shannara Winbush, PA-C   sulfamethoxazole-trimethoprim (BACTRIM DS) 800-160 MG tablet Take 1 tablet by mouth 2 (two) times daily for 7 days. 14 tablet Ellsworth LennoxJames, Bertrum Helmstetter, PA-C      PDMP not reviewed this encounter.   Ellsworth LennoxJames, Blanche Scovell, PA-C 09/20/21 1348

## 2021-09-20 NOTE — Discharge Instructions (Signed)
Advised to use warm compresses to area frequently. Advised to take ibuprofen 800 mg 1 every 8 hours to help reduce the pain and swelling. I have requested an internal surgical referral to have the area evaluated and the cyst possibly removed. The surgical group should be calling you within the next 48 hours to arrange an appointment for you to be seen and the cyst area evaluated. Advised to follow-up with PCP or return to urgent care if symptoms fail to improve

## 2021-09-20 NOTE — ED Triage Notes (Signed)
Pt reports having pain, swelling and redness in left axilla since Wed. Denies drainage. taking advil for pain

## 2021-09-22 ENCOUNTER — Other Ambulatory Visit: Payer: Self-pay

## 2021-09-22 ENCOUNTER — Encounter: Payer: Self-pay | Admitting: Surgery

## 2021-09-22 ENCOUNTER — Ambulatory Visit: Payer: No Typology Code available for payment source | Admitting: Surgery

## 2021-09-22 VITALS — BP 119/87 | HR 102 | Temp 98.4°F | Ht 59.0 in | Wt 116.6 lb

## 2021-09-22 DIAGNOSIS — M7989 Other specified soft tissue disorders: Secondary | ICD-10-CM | POA: Diagnosis not present

## 2021-09-22 DIAGNOSIS — R319 Hematuria, unspecified: Secondary | ICD-10-CM | POA: Insufficient documentation

## 2021-09-22 DIAGNOSIS — L720 Epidermal cyst: Secondary | ICD-10-CM | POA: Diagnosis not present

## 2021-09-22 NOTE — Patient Instructions (Signed)
Continue taking Antibiotic until gone. Warm Compresses are helpful.  Our surgery scheduler will call you within 24-48 hours to schedule your surgery. Please have the Blue surgery sheet available when speaking with her.

## 2021-09-22 NOTE — Progress Notes (Unsigned)
Patient ID: Tanya Nielsen, female   DOB: Mar 12, 1986, 36 y.o.   MRN: [id number]  Chief Complaint: Left axillary epidermal inclusion cyst, with recurrent abscess.  History of Present Illness Tanya Nielsen is a 36 y.o. female with an abscess that she underwent incision and drainage on June 11, she reports she still having drainage, she has not been packing it.  She denies fevers while she is taking Bactrim.  She reports a prior history where she had had a an incision and drainage of the same cyst years previously.  No definitive excision of the area was performed.  She presents now pursuing the same.  She does note that she has an excessive amount of fatty tissue that is not supportive in her axilla it has become a bit of a dependent mass that is in encumbrance with range of motion for her left upper extremity at the shoulder.  This seems to be a bother for her as well, it is within this mass that is the cystic lesion that was recently abscessed and drained.  Past Medical History Past Medical History:  Diagnosis Date   Medical history non-contributory       Past Surgical History:  Procedure Laterality Date   CESAREAN SECTION N/A 06/26/2012   Procedure:  Primary cesarean section with delivery of baby boy at 63. Apgars 9/9.;  Surgeon: Oliver Pila, MD;  Location: [facility];  Service: Obstetrics;  Laterality: N/A;   NO PAST SURGERIES      Allergies  Allergen Reactions   No Known Allergies     Current Outpatient Medications  Medication Sig Dispense Refill   ferrous sulfate 325 (65 FE) MG tablet Take 1 tablet (325 mg total) by mouth daily with breakfast. 30 tablet 3   ibuprofen (ADVIL) 800 MG tablet Take 1 tablet (800 mg total) by mouth 3 (three) times daily. 21 tablet 0   sulfamethoxazole-trimethoprim (BACTRIM DS) 800-160 MG tablet Take 1 tablet by mouth 2 (two) times daily for 7 days. 14 tablet 0   tetrahydrozoline-zinc (VISINE-AC) 0.05-0.25 % ophthalmic solution Place 1 drop into the left eye daily  as needed (For dryness).     tiZANidine (ZANAFLEX) 4 MG tablet Take 1 tablet (4 mg total) by mouth every 6 (six) hours as needed for muscle spasms. 30 tablet 0   No current facility-administered medications for this visit.    Family History History reviewed. No pertinent family history.    Social History Social History   Tobacco Use   Smoking status: Never   Smokeless tobacco: Never  Substance Use Topics   Alcohol use: No   Drug use: No        Review of Systems  Constitutional: Negative.   HENT: Negative.    Eyes: Negative.   Respiratory: Negative.    Cardiovascular: Negative.   Gastrointestinal: Negative.   Genitourinary: Negative.   Skin:  Positive for itching and rash.  Neurological: Negative.   Psychiatric/Behavioral: Negative.        Physical Exam Blood pressure 119/87, pulse (!) 102, temperature 98.4 F (36.9 C), temperature source Oral, height 4\' 11"  (1.499 m), weight 116 lb 9.6 oz (52.9 kg), SpO2 99 %, unknown if currently breastfeeding. Last Weight  Most recent update: 09/22/2021  2:10 PM    Weight  52.9 kg (116 lb 9.6 oz)             CONSTITUTIONAL: Well developed, and nourished, appropriately responsive and aware without distress.   EYES: Sclera non-icteric.   EARS, NOSE,  MOUTH AND THROAT:  The oropharynx is clear. Oral mucosa is pink and moist.  Hearing is intact to voice.  NECK: Trachea is midline, and there is no jugular venous distension.  LYMPH NODES:  Lymph nodes in the neck are not enlarged. RESPIRATORY:  Lungs are clear, and breath sounds are equal bilaterally. Normal respiratory effort without pathologic use of accessory muscles. CARDIOVASCULAR: Heart is regular in rate and rhythm. GI: The abdomen is soft, nontender, and nondistended. There were no palpable masses. MUSCULOSKELETAL:  Symmetrical muscle tone appreciated in all four extremities.    SKIN: Skin turgor is normal. No pathologic skin lesions appreciated.  Velna Hatchet is present as  chaperone.  Left axilla is remarkable for redundant/laxity of the axillary dermal floor, with lack of loss of suspensory ligament.  There is a palpable dermal cyst within this redundant skin and adipose that is pannus like in nature.  However this patient is clearly not obese nor as she lost a significant amount of weight loss.  Although I did not examine her right axilla at this is reportedly asymmetric.  There appears to be minimal active drainage at present, no remarkable induration or increased caloric of the adjacent skin.  No hyperemia, or cellulitic changes. NEUROLOGIC:  Motor and sensation appear grossly normal.  Cranial nerves are grossly without defect. PSYCH:  Alert and oriented to person, place and time. Affect is appropriate for situation.  Data Reviewed I have personally reviewed what is currently available of the patient's imaging, recent labs and medical records.   Labs:     Latest Ref Rng & Units 02/27/2019    5:57 AM 02/26/2019   12:59 AM 06/27/2012    6:10 AM  CBC  WBC 4.0 - 10.5 K/uL 16.8  14.7  16.1   Hemoglobin 12.0 - 15.0 g/dL 8.0  35.5  8.7   Hematocrit 36.0 - 46.0 % 25.8  33.7  27.1   Platelets 150 - 400 K/uL 285  366  253       Latest Ref Rng & Units 12/26/2009    4:07 PM  CMP  Glucose 70 - 99 mg/dL 81   BUN 6 - 23 mg/dL 12   Creatinine 0.4 - 1.2 mg/dL 0.5   Sodium 732 - 202 mEq/L 138   Potassium 3.5 - 5.1 mEq/L 3.8   Chloride 96 - 112 mEq/L 107       Imaging:  Within last 24 hrs: No results found.  Assessment    Epidermal inclusion cyst left axilla, abscess. Soft tissue mass with redundant left axillary skin. Patient Active Problem List   Diagnosis Date Noted   Blood in urine 09/22/2021   Pregnancy 02/26/2019    Plan    Excision of left axillary epidermal inclusion cyst with redundancy of skin. Risks of infection, bleeding, recurrence, persistence of soft tissue mass.  Risks of anesthesia also discussed.  I believe she is aware that these are  not all inclusive and desires to proceed.  Questions answered, no guarantees ever expressed or implied.  Face-to-face time spent with the patient and accompanying care providers(if present) was 30 minutes, with more than 50% of the time spent counseling, educating, and coordinating care of the patient.    These notes generated with voice recognition software. I apologize for typographical errors.  Campbell Lerner M.D., FACS 09/22/2021, 4:45 PM

## 2021-09-23 ENCOUNTER — Telehealth: Payer: Self-pay | Admitting: Surgery

## 2021-09-23 DIAGNOSIS — M7989 Other specified soft tissue disorders: Secondary | ICD-10-CM | POA: Insufficient documentation

## 2021-09-23 DIAGNOSIS — L720 Epidermal cyst: Secondary | ICD-10-CM | POA: Insufficient documentation

## 2021-09-23 NOTE — Telephone Encounter (Signed)
Patient has been advised of Pre-Admission date/time, COVID Testing date and Surgery date.  Surgery Date: 10/02/21 Preadmission Testing Date: 09/29/21 (phone 8a-1p) Covid Testing Date: Not needed.     Patient has been made aware to call [phone], between 1-3:00pm the day before surgery, to find out what time to arrive for surgery.

## 2021-09-29 ENCOUNTER — Ambulatory Visit: Payer: Self-pay | Admitting: Surgery

## 2021-09-29 ENCOUNTER — Encounter
Admission: RE | Admit: 2021-09-29 | Discharge: 2021-09-29 | Disposition: A | Discharge status: Home / Self Care | Payer: No Typology Code available for payment source | Source: Ambulatory Visit | Attending: Surgery | Admitting: Surgery

## 2021-09-29 VITALS — Ht 59.0 in | Wt 116.0 lb

## 2021-09-29 DIAGNOSIS — Z01812 Encounter for preprocedural laboratory examination: Secondary | ICD-10-CM

## 2021-09-29 DIAGNOSIS — M7989 Other specified soft tissue disorders: Secondary | ICD-10-CM

## 2021-09-29 HISTORY — DX: Localized swelling, mass and lump, left upper limb: R22.32

## 2021-09-29 NOTE — Patient Instructions (Addendum)
Your procedure is scheduled on: Friday, June 23 Report to the Registration Desk on the 1st floor of the [facility] To find out your arrival time, please call [phone] between 1PM - 3PM on: Thursday, June 22 If your arrival time is 6:00 am, do not arrive prior to that time as the [facility] entrance doors do not open until 6:00 am.  REMEMBER: Instructions that are not followed completely may result in serious medical risk, up to and including death; or upon the discretion of your surgeon and anesthesiologist your surgery may need to be rescheduled.  Do not eat food after midnight the night before surgery.  No gum chewing, lozengers or hard candies.  You may however, drink CLEAR liquids up to 2 hours before you are scheduled to arrive for your surgery. Do not drink anything within 2 hours of your scheduled arrival time.  Clear liquids include: - water  - apple juice without pulp - gatorade (not RED colors) - black coffee or tea (Do NOT add milk or creamers to the coffee or tea) Do NOT drink anything that is not on this list.  DO NOT TAKE ANY MEDICATIONS THE MORNING OF SURGERY  One week prior to surgery: starting today, June 20 Stop Anti-inflammatories (NSAIDS) such as Advil, Aleve, Ibuprofen, Motrin, Naproxen, Naprosyn and Aspirin based products such as Excedrin, Goodys Powder, BC Powder. Stop ANY OVER THE COUNTER supplements until after surgery. You may however, continue to take Tylenol if needed for pain up until the day of surgery.  No Alcohol for 24 hours before or after surgery.  No Smoking including e-cigarettes for 24 hours prior to surgery.  No chewable tobacco products for at least 6 hours prior to surgery.  No nicotine patches on the day of surgery.  Do not use any "recreational" drugs for at least a week prior to your surgery.  Please be advised that the combination of cocaine and anesthesia may have negative outcomes, up to and including death. If you test  positive for cocaine, your surgery will be cancelled.  On the morning of surgery brush your teeth with toothpaste and water, you may rinse your mouth with mouthwash if you wish. Do not swallow any toothpaste or mouthwash.  Use CHG Soap as directed on instruction sheet or shower using antibacterial soap prior to arriving at the hospital.  Do not wear jewelry, make-up, hairpins, clips or nail polish.  Do not wear lotions, powders, or perfumes.   Do not shave body from the neck down 48 hours prior to surgery just in case you cut yourself which could leave a site for infection.  Also, freshly shaved skin may become irritated if using the CHG soap.  Do not bring valuables to the hospital. [facility] is not responsible for any missing/lost belongings or valuables.   Notify your doctor if there is any change in your medical condition (cold, fever, infection).  Wear comfortable clothing (specific to your surgery type) to the hospital.  After surgery, you can help prevent lung complications by doing breathing exercises.  Take deep breaths and cough every 1-2 hours. Your doctor may order a device called an Incentive Spirometer to help you take deep breaths.  If you are being discharged the day of surgery, you will not be allowed to drive home. You will need a responsible adult (18 years or older) to drive you home and stay with you that night.   If you are taking public transportation, you will need to have  a responsible adult (18 years or older) with you. Please confirm with your physician that it is acceptable to use public transportation.   Please call the Pre-admissions Testing Dept. at [phone] if you have any questions about these instructions.  Surgery Visitation Policy:  Patients undergoing a surgery or procedure may have two family members or support persons with them as long as the person is not COVID-19 positive or experiencing its symptoms.

## 2021-10-01 MED ORDER — CEFAZOLIN SODIUM-DEXTROSE 2-4 GM/100ML-% IV SOLN
2.0000 g | INTRAVENOUS | Status: AC
  Administered 2021-10-02: 2 g via INTRAVENOUS

## 2021-10-01 MED ORDER — LACTATED RINGERS IV SOLN: INTRAVENOUS | Status: DC

## 2021-10-01 MED ORDER — ORAL CARE MOUTH RINSE: 15.0000 mL | Freq: Once | OROMUCOSAL | Status: AC

## 2021-10-01 MED ORDER — BUPIVACAINE LIPOSOME 1.3 % IJ SUSP: 20.0000 mL | Freq: Once | INTRAMUSCULAR | Status: DC

## 2021-10-01 MED ORDER — FAMOTIDINE 20 MG PO TABS: 20.0000 mg | ORAL_TABLET | Freq: Once | ORAL | Status: AC

## 2021-10-01 MED ORDER — GABAPENTIN 300 MG PO CAPS: 300.0000 mg | ORAL_CAPSULE | ORAL | Status: AC

## 2021-10-01 MED ORDER — CELECOXIB 200 MG PO CAPS: 200.0000 mg | ORAL_CAPSULE | ORAL | Status: AC

## 2021-10-01 MED ORDER — CHLORHEXIDINE GLUCONATE 0.12 % MT SOLN: 15.0000 mL | Freq: Once | OROMUCOSAL | Status: AC

## 2021-10-01 MED ORDER — CHLORHEXIDINE GLUCONATE CLOTH 2 % EX PADS: 6.0000 | MEDICATED_PAD | Freq: Once | CUTANEOUS | Status: DC

## 2021-10-01 MED ORDER — ACETAMINOPHEN 500 MG PO TABS: 1000.0000 mg | ORAL_TABLET | ORAL | Status: AC

## 2021-10-02 ENCOUNTER — Other Ambulatory Visit: Payer: Self-pay

## 2021-10-02 ENCOUNTER — Ambulatory Visit
Admission: RE | Admit: 2021-10-02 | Discharge: 2021-10-02 | Disposition: A | Discharge status: Home / Self Care | Payer: No Typology Code available for payment source | Attending: Surgery | Admitting: Surgery

## 2021-10-02 ENCOUNTER — Encounter: Payer: Self-pay | Admitting: Surgery

## 2021-10-02 ENCOUNTER — Ambulatory Visit: Payer: No Typology Code available for payment source | Admitting: Anesthesiology

## 2021-10-02 ENCOUNTER — Encounter
Admission: RE | Disposition: A | Discharge status: Home / Self Care | Payer: Self-pay | Source: Home / Self Care | Attending: Surgery

## 2021-10-02 DIAGNOSIS — L918 Other hypertrophic disorders of the skin: Secondary | ICD-10-CM | POA: Diagnosis not present

## 2021-10-02 DIAGNOSIS — L720 Epidermal cyst: Secondary | ICD-10-CM

## 2021-10-02 DIAGNOSIS — L299 Pruritus, unspecified: Secondary | ICD-10-CM | POA: Diagnosis not present

## 2021-10-02 DIAGNOSIS — M7989 Other specified soft tissue disorders: Secondary | ICD-10-CM

## 2021-10-02 DIAGNOSIS — Z01812 Encounter for preprocedural laboratory examination: Secondary | ICD-10-CM

## 2021-10-02 DIAGNOSIS — R222 Localized swelling, mass and lump, trunk: Secondary | ICD-10-CM | POA: Diagnosis present

## 2021-10-02 HISTORY — PX: MASS EXCISION: SHX2000

## 2021-10-02 LAB — POCT PREGNANCY, URINE: Preg Test, Ur: NEGATIVE

## 2021-10-02 SURGERY — EXCISION MASS
Anesthesia: General | Laterality: Left

## 2021-10-02 MED ORDER — MIDAZOLAM HCL 2 MG/2ML IJ SOLN
INTRAMUSCULAR | Status: AC
Start: 2021-10-02 — End: ?
  Filled 2021-10-02: qty 2

## 2021-10-02 MED ORDER — OXYCODONE HCL 5 MG PO TABS
5.0000 mg | ORAL_TABLET | Freq: Once | ORAL | Status: AC | PRN
  Administered 2021-10-02: 5 mg via ORAL

## 2021-10-02 MED ORDER — DEXAMETHASONE SODIUM PHOSPHATE 10 MG/ML IJ SOLN
INTRAMUSCULAR | Status: AC
  Filled 2021-10-02: qty 1

## 2021-10-02 MED ORDER — FAMOTIDINE 20 MG PO TABS
ORAL_TABLET | ORAL | Status: AC
  Administered 2021-10-02: 20 mg via ORAL
  Filled 2021-10-02: qty 1

## 2021-10-02 MED ORDER — FENTANYL CITRATE (PF) 100 MCG/2ML IJ SOLN: 25.0000 ug | INTRAMUSCULAR | Status: DC | PRN

## 2021-10-02 MED ORDER — LIDOCAINE HCL (PF) 2 % IJ SOLN
INTRAMUSCULAR | Status: AC
  Filled 2021-10-02: qty 5

## 2021-10-02 MED ORDER — DEXAMETHASONE SODIUM PHOSPHATE 10 MG/ML IJ SOLN
INTRAMUSCULAR | Status: DC | PRN
  Administered 2021-10-02: 6 mg via INTRAVENOUS

## 2021-10-02 MED ORDER — OXYCODONE HCL 5 MG PO TABS
ORAL_TABLET | ORAL | Status: AC
  Filled 2021-10-02: qty 1

## 2021-10-02 MED ORDER — PROPOFOL 10 MG/ML IV BOLUS
INTRAVENOUS | Status: AC
  Filled 2021-10-02: qty 40

## 2021-10-02 MED ORDER — PROMETHAZINE HCL 25 MG/ML IJ SOLN: 6.2500 mg | INTRAMUSCULAR | Status: DC | PRN

## 2021-10-02 MED ORDER — ONDANSETRON HCL 4 MG/2ML IJ SOLN
INTRAMUSCULAR | Status: DC | PRN
  Administered 2021-10-02: 4 mg via INTRAVENOUS

## 2021-10-02 MED ORDER — BUPIVACAINE-EPINEPHRINE (PF) 0.25% -1:200000 IJ SOLN
INTRAMUSCULAR | Status: DC | PRN
  Administered 2021-10-02: 30 mL

## 2021-10-02 MED ORDER — FENTANYL CITRATE (PF) 100 MCG/2ML IJ SOLN
INTRAMUSCULAR | Status: DC | PRN
Start: 2021-10-02 — End: 2021-10-02
  Administered 2021-10-02: 50 ug via INTRAVENOUS
  Administered 2021-10-02: 25 ug via INTRAVENOUS

## 2021-10-02 MED ORDER — MIDAZOLAM HCL 2 MG/2ML IJ SOLN
INTRAMUSCULAR | Status: DC | PRN
  Administered 2021-10-02: 2 mg via INTRAVENOUS

## 2021-10-02 MED ORDER — CELECOXIB 200 MG PO CAPS
ORAL_CAPSULE | ORAL | Status: AC
  Administered 2021-10-02: 200 mg via ORAL
  Filled 2021-10-02: qty 1

## 2021-10-02 MED ORDER — ACETAMINOPHEN 10 MG/ML IV SOLN: 1000.0000 mg | Freq: Once | INTRAVENOUS | Status: DC | PRN

## 2021-10-02 MED ORDER — LIDOCAINE HCL (CARDIAC) PF 100 MG/5ML IV SOSY
PREFILLED_SYRINGE | INTRAVENOUS | Status: DC | PRN
  Administered 2021-10-02: 60 mg via INTRAVENOUS

## 2021-10-02 MED ORDER — OXYCODONE HCL 5 MG/5ML PO SOLN: 5.0000 mg | Freq: Once | ORAL | Status: AC | PRN

## 2021-10-02 MED ORDER — DROPERIDOL 2.5 MG/ML IJ SOLN: 0.6250 mg | Freq: Once | INTRAMUSCULAR | Status: AC | PRN

## 2021-10-02 MED ORDER — BUPIVACAINE-EPINEPHRINE (PF) 0.25% -1:200000 IJ SOLN
INTRAMUSCULAR | Status: AC
Start: 2021-10-02 — End: ?
  Filled 2021-10-02: qty 30

## 2021-10-02 MED ORDER — GABAPENTIN 300 MG PO CAPS
ORAL_CAPSULE | ORAL | Status: AC
  Administered 2021-10-02: 300 mg via ORAL
  Filled 2021-10-02: qty 1

## 2021-10-02 MED ORDER — FENTANYL CITRATE (PF) 100 MCG/2ML IJ SOLN
INTRAMUSCULAR | Status: AC
  Filled 2021-10-02: qty 2

## 2021-10-02 MED ORDER — CEFAZOLIN SODIUM-DEXTROSE 2-4 GM/100ML-% IV SOLN
INTRAVENOUS | Status: AC
  Filled 2021-10-02: qty 100

## 2021-10-02 MED ORDER — DROPERIDOL 2.5 MG/ML IJ SOLN
INTRAMUSCULAR | Status: AC
  Administered 2021-10-02: 0.625 mg via INTRAVENOUS
  Filled 2021-10-02: qty 2

## 2021-10-02 MED ORDER — HYDROCODONE-ACETAMINOPHEN 5-325 MG PO TABS: 1.0000 | ORAL_TABLET | Freq: Four times a day (QID) | ORAL | 0 refills | Status: DC | PRN

## 2021-10-02 MED ORDER — ACETAMINOPHEN 500 MG PO TABS
ORAL_TABLET | ORAL | Status: AC
  Administered 2021-10-02: 1000 mg via ORAL
  Filled 2021-10-02: qty 2

## 2021-10-02 MED ORDER — ONDANSETRON HCL 4 MG/2ML IJ SOLN
INTRAMUSCULAR | Status: AC
  Filled 2021-10-02: qty 2

## 2021-10-02 MED ORDER — PROPOFOL 10 MG/ML IV BOLUS
INTRAVENOUS | Status: DC | PRN
  Administered 2021-10-02: 170 mg via INTRAVENOUS
  Administered 2021-10-02: 30 mg via INTRAVENOUS

## 2021-10-02 MED ORDER — CHLORHEXIDINE GLUCONATE 0.12 % MT SOLN
OROMUCOSAL | Status: AC
  Administered 2021-10-02: 15 mL via OROMUCOSAL
  Filled 2021-10-02: qty 15

## 2021-10-02 SURGICAL SUPPLY — 30 items
ADH SKN CLS APL DERMABOND .7 (GAUZE/BANDAGES/DRESSINGS) ×1
APL PRP STRL LF DISP 70% ISPRP (MISCELLANEOUS) ×1
BLADE SURG 15 STRL LF DISP TIS (BLADE) ×1 IMPLANT
BLADE SURG 15 STRL SS (BLADE) ×2
CHLORAPREP W/TINT 26 (MISCELLANEOUS) ×2 IMPLANT
DERMABOND ADVANCED (GAUZE/BANDAGES/DRESSINGS) ×1
DERMABOND ADVANCED .7 DNX12 (GAUZE/BANDAGES/DRESSINGS) ×1 IMPLANT
DRAPE LAPAROTOMY 77X122 PED (DRAPES) ×2 IMPLANT
ELECT CAUTERY BLADE TIP 2.5 (TIP) ×2
ELECT REM PT RETURN 9FT ADLT (ELECTROSURGICAL) ×2
ELECTRODE CAUTERY BLDE TIP 2.5 (TIP) ×1 IMPLANT
ELECTRODE REM PT RTRN 9FT ADLT (ELECTROSURGICAL) ×1 IMPLANT
GAUZE 4X4 16PLY ~~LOC~~+RFID DBL (SPONGE) ×1 IMPLANT
GLOVE ORTHO TXT STRL SZ7.5 (GLOVE) ×6 IMPLANT
GOWN STRL REUS W/ TWL LRG LVL3 (GOWN DISPOSABLE) ×1 IMPLANT
GOWN STRL REUS W/ TWL XL LVL3 (GOWN DISPOSABLE) ×1 IMPLANT
GOWN STRL REUS W/TWL LRG LVL3 (GOWN DISPOSABLE) ×4
GOWN STRL REUS W/TWL XL LVL3 (GOWN DISPOSABLE) ×2
KIT TURNOVER KIT A (KITS) ×2 IMPLANT
MANIFOLD NEPTUNE II (INSTRUMENTS) ×2 IMPLANT
NEEDLE HYPO 22GX1.5 SAFETY (NEEDLE) ×2 IMPLANT
PACK BASIN MINOR ARMC (MISCELLANEOUS) ×2 IMPLANT
SUT MNCRL 4-0 (SUTURE) ×2
SUT MNCRL 4-0 27XMFL (SUTURE) ×1
SUT VIC AB 3-0 SH 27 (SUTURE) ×6
SUT VIC AB 3-0 SH 27X BRD (SUTURE) ×1 IMPLANT
SUTURE MNCRL 4-0 27XMF (SUTURE) ×1 IMPLANT
SYR 10ML LL (SYRINGE) ×2 IMPLANT
SYR BULB IRRIG 60ML STRL (SYRINGE) ×2 IMPLANT
WATER STERILE IRR 500ML POUR (IV SOLUTION) ×1 IMPLANT

## 2021-10-02 NOTE — Op Note (Addendum)
Excision left axillary soft tissue mass  Pre-operative Diagnosis: Left axillary soft tissue mass  Post-operative Diagnosis: same.    Surgeon: Campbell Lerner, M.D., FACS  Anesthesia: General LMA  Findings: Significant induration from prior inflammatory process has resolved.  No evidence of active inflammatory changes or infection.  Soft tissue redundancy with skin redundancy of the axilla, suspect secondary to lipomatous changes and loss of suspensory axillary ligaments.  Estimated Blood Loss: 10 mL         Specimens: Skin and soft tissues of the left axilla, soft tissue masses of 5-7 cm total size.        Complications: none              Procedure Details  The patient was seen again in the Holding Room. The benefits, complications, treatment options, and expected outcomes were discussed with the patient. The risks of bleeding, infection, recurrence of symptoms, failure to resolve symptoms, unanticipated injury, prosthetic placement, prosthetic infection, any of which could require further surgery were reviewed with the patient. The likelihood of improving the patient's symptoms with return to their baseline status is expected.  The patient and/or family concurred with the proposed plan, giving informed consent.  The patient was taken to Operating Room, identified and the procedure verified.    Prior to the induction of general anesthesia, antibiotic prophylaxis was administered. VTE prophylaxis was in place.  General anesthesia was then administered and tolerated well. After the induction, the patient was positioned in the supine position and the left axilla was prepped with Chloraprep and draped in the sterile fashion.  A Time Out was held and the above information confirmed. Local infiltration of quarter percent Marcaine with epi was utilized to infiltrate the skin. Elliptical incision (anterior to posterior long axis) was made to excise the prior scar, and the redundant skin of the left  axilla, this immediately overlies the density of the soft tissue which was felt to be the source of the infection/inflammatory changes.  I raised the flap medially to ensure adequate excision of the redundant lipomatous tissues.  Laterally we essentially avoided any flap creation and excise the superficial lipomatous axillary tissue.  Hemostasis assured with electrocautery.  The medial apex approached the inferior aspect of the distal/lateral pectoralis major. The wound was then closed in 3 layers, attempting to reestablish a deep fascial floor of the axilla with a running 3-0 Vicryl. I then added additional local anesthesia to the deeper tissues.  Reapproximated the subcutaneous tissues with running 3-0 Vicryl, and dermal tissues approximated with interrupted 3-0 Vicryl. The skin was finally closed with 4-0 Monocryl subcuticular and sealed with Dermabond.      Campbell Lerner M.D., [facility] 10/02/2021 8:46 AM

## 2021-10-05 ENCOUNTER — Telehealth: Payer: Self-pay | Admitting: *Deleted

## 2021-10-05 LAB — SURGICAL PATHOLOGY

## 2021-10-05 NOTE — Telephone Encounter (Signed)
Patient had surgery on 10/02/21 Dr Claudine Mouton axillary mass excision and she would like to come pick up a note for work stating that she is out of work due to surgery and will determine a release date when she can return to work on her post op visit on 10/15/21. Please call and advise

## 2021-10-15 ENCOUNTER — Encounter: Payer: Self-pay | Admitting: Physician Assistant

## 2021-10-15 ENCOUNTER — Ambulatory Visit (INDEPENDENT_AMBULATORY_CARE_PROVIDER_SITE_OTHER): Payer: Medicaid Other | Admitting: Physician Assistant

## 2021-10-15 VITALS — BP 103/70 | HR 94 | Temp 98.0°F | Wt 112.2 lb

## 2021-10-15 DIAGNOSIS — M7989 Other specified soft tissue disorders: Secondary | ICD-10-CM

## 2021-10-15 DIAGNOSIS — Z09 Encounter for follow-up examination after completed treatment for conditions other than malignant neoplasm: Secondary | ICD-10-CM

## 2021-10-15 DIAGNOSIS — L720 Epidermal cyst: Secondary | ICD-10-CM

## 2021-10-15 NOTE — Patient Instructions (Signed)
Excision of Skin Lesions, Care After The following information offers guidance on how to care for yourself after your procedure. Your health care provider may also give you more specific instructions. If you have problems or questions, contact your health care provider. What can I expect after the procedure? After your procedure, it is common to have: Soreness or mild pain. Some redness and swelling. Follow these instructions at home: Excision site care  Follow instructions from your health care provider about how to take care of your excision site. Make sure you: Wash your hands with soap and water for at least 20 seconds before and after you change your bandage (dressing). If soap and water are not available, use hand sanitizer. Change your dressing as told by your health care provider. Leave stitches (sutures), skin glue, or adhesive strips in place. These skin closures may need to stay in place for 2 weeks or longer. If adhesive strip edges start to loosen and curl up, you may trim the loose edges. Do not remove adhesive strips completely unless your health care provider tells you to do that. Check the excision area every day for signs of infection. Watch for: More redness, swelling, or pain. Fluid or blood. Warmth. Pus or a bad smell. Keep the site clean, dry, and protected for at least 48 hours. For bleeding, apply gentle but firm pressure to the area using a folded towel for 20 minutes. Do not take baths, swim, or use a hot tub until your health care provider approves. Ask your health care provider if you may take showers. You may only be allowed to take sponge baths. General instructions Take over-the-counter and prescription medicines only as told by your health care provider. Follow instructions from your health care provider about how to minimize scarring. Scarring should lessen over time. Avoid sun exposure until the area has healed. Use sunscreen to protect the area from the sun  after it has healed. Avoid high-impact exercise and activities until the sutures are removed or the area heals. Keep all follow-up visits. This is important. Contact a health care provider if: You have more redness, swelling, or pain around your excision site. You have fluid or blood coming from your excision site. Your excision site feels warm to the touch. You have pus or a bad smell coming from your excision site. You have a fever. You have pain that does not improve in 2-3 days after your procedure. Get help right away if: You have bleeding that does not stop with pressure or a dressing. Your wound opens up. Summary Take over-the-counter and prescription medicines only as told by your health care provider. Change your dressing as told by your health care provider. Contact a health care provider if you have redness, swelling, pain, or other signs of infection around your excision site. Keep all follow-up visits. This is important. This information is not intended to replace advice given to you by your health care provider. Make sure you discuss any questions you have with your health care provider. Document Revised: 10/28/2020 Document Reviewed: 10/28/2020 Elsevier Patient Education  2023 [employer] Inc[employer]  

## 2021-10-15 NOTE — Progress Notes (Signed)
[facility] POST-OP OFFICE VISIT  10/15/2021  HPI: Tanya Nielsen is a 36 y.o. female 13 days s/p excision of left axillary mass with Dr Claudine Mouton   She is overall doing well Still with soreness and pain in the left axilla; about a 5/10; worse with movements. She is out of post-op pain control No fever, chills She has not noticed any abnormalities with the incision No other complaints.   Vital signs: BP 103/70   Pulse 94   Temp 98 F (36.7 C) (Oral)   Wt 112 lb 3.2 oz (50.9 kg)   SpO2 97%   BMI 22.66 kg/m    Physical Exam: Constitutional: Well appearing female, NAD Skin: 8 cm incision to the left axilla; this is healing well; somewhat indurated expectedly, no erythema, no drainage   Assessment/Plan: This is a 36 y.o. female 13 days s/p excision of left axillary mass    - Pain control prn; Encouraged OTC modalities including tylenol, motrin, ice packs  - Reviewed wound care recommendation  - Reviewed surgical pathology; disrupted epidermal cyst   - She will follow up in ~2 weeks for wound check and clearance to return to work. She understands to call in the interim with questions/concerns   -- Lynden Oxford, PA-C [facility] 10/15/2021, 3:45 PM M-F: 7am - 4pm

## 2021-10-29 ENCOUNTER — Encounter: Payer: Self-pay | Admitting: Physician Assistant

## 2021-10-29 ENCOUNTER — Ambulatory Visit (INDEPENDENT_AMBULATORY_CARE_PROVIDER_SITE_OTHER): Payer: No Typology Code available for payment source | Admitting: Physician Assistant

## 2021-10-29 VITALS — BP 123/83 | HR 99 | Temp 98.1°F | Wt 112.6 lb

## 2021-10-29 DIAGNOSIS — L720 Epidermal cyst: Secondary | ICD-10-CM

## 2021-10-29 DIAGNOSIS — M7989 Other specified soft tissue disorders: Secondary | ICD-10-CM

## 2021-10-29 DIAGNOSIS — Z09 Encounter for follow-up examination after completed treatment for conditions other than malignant neoplasm: Secondary | ICD-10-CM

## 2021-10-29 MED ORDER — IBUPROFEN 800 MG PO TABS: 800.0000 mg | ORAL_TABLET | Freq: Three times a day (TID) | ORAL | 0 refills | Status: AC | PRN

## 2021-10-29 NOTE — Progress Notes (Signed)
[facility] POST-OP OFFICE VISIT  10/29/2021  HPI: Tanya Nielsen is a 36 y.o. female ~4 weeks s/p excision of left axillary mass with Dr Claudine Mouton   She is doing well Still having some pain in the left axilla and subjective numbness to the inner arm to the level of the elbow; no weakness or loss of strength No fever, chills No issues with the wound; no drainage   Vital signs: BP 123/83   Pulse 99   Temp 98.1 F (36.7 C) (Oral)   Wt 112 lb 9.6 oz (51.1 kg)   SpO2 99%   BMI 22.74 kg/m    Physical Exam: Constitutional: Well appearing female, NAD Skin: 8 cm incision to the left axilla; this is healing well; somewhat indurated expectedly, no erythema, no drainage   Assessment/Plan: This is a 36 y.o. female ~4 weeks s/p excision of left axillary mass   - Pain control prn; will refill ibuprofen Rx. Consider gabapentin given she is describing what I feel to believe nerve pain/irritation; however, she declines  - Reviewed wound care recommendation  - Reviewed lifting restrictions; 6 weeks total (2 more weeks); return to work note given   - She can follow up on as needed basis; She understands to call with questions/concerns  -- Lynden Oxford, PA-C [facility] 10/29/2021, 2:43 PM M-F: 7am - 4pm

## 2021-10-29 NOTE — Patient Instructions (Signed)
If you have any concerns or questions, please feel free to call our office.
